# Patient Record
Sex: Male | Born: 1965 | Race: White | Hispanic: No | Marital: Single | Smoking: Current every day smoker
Health system: Southern US, Community
[De-identification: ages and names within clinical notes are randomized; demographics above are authoritative.]

## PROBLEM LIST (undated history)

## (undated) DIAGNOSIS — F419 Anxiety disorder, unspecified: Secondary | ICD-10-CM

## (undated) DIAGNOSIS — G4730 Sleep apnea, unspecified: Secondary | ICD-10-CM

## (undated) DIAGNOSIS — I10 Essential (primary) hypertension: Secondary | ICD-10-CM

## (undated) DIAGNOSIS — D649 Anemia, unspecified: Secondary | ICD-10-CM

## (undated) DIAGNOSIS — F32A Depression, unspecified: Secondary | ICD-10-CM

## (undated) DIAGNOSIS — R569 Unspecified convulsions: Secondary | ICD-10-CM

## (undated) DIAGNOSIS — E785 Hyperlipidemia, unspecified: Secondary | ICD-10-CM

## (undated) DIAGNOSIS — F1910 Other psychoactive substance abuse, uncomplicated: Secondary | ICD-10-CM

## (undated) DIAGNOSIS — F329 Major depressive disorder, single episode, unspecified: Secondary | ICD-10-CM

## (undated) DIAGNOSIS — Z5189 Encounter for other specified aftercare: Secondary | ICD-10-CM

## (undated) HISTORY — DX: Encounter for other specified aftercare: Z51.89

## (undated) HISTORY — PX: COLONOSCOPY: SHX174

## (undated) HISTORY — PX: SIGMOIDOSCOPY: SUR1295

## (undated) HISTORY — DX: Hyperlipidemia, unspecified: E78.5

## (undated) HISTORY — DX: Other psychoactive substance abuse, uncomplicated: F19.10

## (undated) HISTORY — DX: Anxiety disorder, unspecified: F41.9

## (undated) HISTORY — DX: Depression, unspecified: F32.A

## (undated) HISTORY — DX: Anemia, unspecified: D64.9

## (undated) HISTORY — DX: Sleep apnea, unspecified: G47.30

## (undated) HISTORY — DX: Essential (primary) hypertension: I10

## (undated) HISTORY — DX: Unspecified convulsions: R56.9

## (undated) HISTORY — DX: Major depressive disorder, single episode, unspecified: F32.9

---

## 2005-11-12 HISTORY — PX: ANKLE SURGERY: SHX546

## 2015-12-19 ENCOUNTER — Ambulatory Visit: Payer: Self-pay | Attending: Podiatry

## 2015-12-19 ENCOUNTER — Other Ambulatory Visit: Payer: Self-pay | Admitting: Sports Medicine

## 2015-12-19 ENCOUNTER — Ambulatory Visit: Payer: Self-pay

## 2015-12-19 DIAGNOSIS — B351 Tinea unguium: Secondary | ICD-10-CM

## 2015-12-19 MED ORDER — CICLOPIROX 8 % EX SOLN: Freq: Every day | CUTANEOUS | Status: DC

## 2016-02-29 ENCOUNTER — Telehealth: Payer: Self-pay | Admitting: Internal Medicine

## 2016-02-29 NOTE — Telephone Encounter (Signed)
yes

## 2016-02-29 NOTE — Telephone Encounter (Signed)
Pt called request to be Dr. Yetta BarreJones new pt, he said Dr. Lorenda CahillKathy Glenn refer him. Please Terrill Mohradise, [employer] is insurance.

## 2016-04-10 ENCOUNTER — Encounter: Payer: Self-pay | Admitting: Internal Medicine

## 2016-04-10 ENCOUNTER — Ambulatory Visit (INDEPENDENT_AMBULATORY_CARE_PROVIDER_SITE_OTHER): Payer: BLUE CROSS/BLUE SHIELD | Admitting: Internal Medicine

## 2016-04-10 VITALS — BP 118/70 | HR 67 | Temp 98.6°F | Resp 16 | Ht 65.0 in | Wt 159.0 lb

## 2016-04-10 DIAGNOSIS — R05 Cough: Secondary | ICD-10-CM

## 2016-04-10 DIAGNOSIS — F419 Anxiety disorder, unspecified: Secondary | ICD-10-CM

## 2016-04-10 DIAGNOSIS — Z23 Encounter for immunization: Secondary | ICD-10-CM | POA: Diagnosis not present

## 2016-04-10 DIAGNOSIS — R0600 Dyspnea, unspecified: Secondary | ICD-10-CM

## 2016-04-10 DIAGNOSIS — F418 Other specified anxiety disorders: Secondary | ICD-10-CM

## 2016-04-10 DIAGNOSIS — R059 Cough, unspecified: Secondary | ICD-10-CM | POA: Insufficient documentation

## 2016-04-10 DIAGNOSIS — N522 Drug-induced erectile dysfunction: Secondary | ICD-10-CM | POA: Diagnosis not present

## 2016-04-10 DIAGNOSIS — Z0000 Encounter for general adult medical examination without abnormal findings: Secondary | ICD-10-CM | POA: Diagnosis not present

## 2016-04-10 DIAGNOSIS — F329 Major depressive disorder, single episode, unspecified: Secondary | ICD-10-CM | POA: Insufficient documentation

## 2016-04-10 DIAGNOSIS — F1011 Alcohol abuse, in remission: Secondary | ICD-10-CM | POA: Insufficient documentation

## 2016-04-10 DIAGNOSIS — F32A Depression, unspecified: Secondary | ICD-10-CM | POA: Insufficient documentation

## 2016-04-10 DIAGNOSIS — R0609 Other forms of dyspnea: Secondary | ICD-10-CM | POA: Insufficient documentation

## 2016-04-10 DIAGNOSIS — Z720 Tobacco use: Secondary | ICD-10-CM

## 2016-04-10 LAB — FECAL OCCULT BLOOD, GUAIAC: FECAL OCCULT BLD: NEGATIVE

## 2016-04-10 MED ORDER — PAROXETINE HCL 20 MG PO TABS: 20.0000 mg | ORAL_TABLET | Freq: Every day | ORAL | Status: DC

## 2016-04-10 MED ORDER — BUPROPION HCL ER (SR) 100 MG PO TB12: 100.0000 mg | ORAL_TABLET | Freq: Every day | ORAL | Status: DC

## 2016-04-10 MED ORDER — GABAPENTIN 300 MG PO CAPS: 300.0000 mg | ORAL_CAPSULE | Freq: Three times a day (TID) | ORAL | Status: DC

## 2016-04-10 MED ORDER — SILDENAFIL CITRATE 50 MG PO TABS: 50.0000 mg | ORAL_TABLET | Freq: Every day | ORAL | Status: DC | PRN

## 2016-04-10 NOTE — Progress Notes (Signed)
Subjective:  Patient ID: Collin Horne, male    DOB: August 15, 1966  Age: 50 y.o. MRN: [id number]  CC: Annual Exam  NEW TO ME  HPI Collin Horne presents for a CPX.  He complains of chronic, nonproductive cough over the last 1-2 years with mild dyspnea on exertion. He has had no episodes of chest pain, diaphoresis, fatigue, syncope, near syncope, or palpitations. He has a 30+ pack year history of tobacco abuse.  He has a history of alcohol dependence and has been sober for about 15 months. He is also being treated for depression and anxiety with Paxil, Wellbutrin, and Neurontin. He is doing very well on this regimen and tells me he is going to be seeing a new psychiatrist in.  History Collin Horne has a past medical history of Hyperlipidemia; Substance abuse; and Depression.   He has past surgical history that includes Ankle surgery (2007).   His family history includes Alcohol abuse in his father; Arthritis in his mother; Depression in his father and sister; Early death in his father. There is no history of Heart disease, Hyperlipidemia, Hypertension, Kidney disease, Stroke, or Asthma.He reports that he has been smoking Cigarettes.  He has a 30 pack-year smoking history. He has never used smokeless tobacco. He reports that he does not drink alcohol. His drug history is not on file.  Outpatient Prescriptions Prior to Visit  Medication Sig Dispense Refill  . ciclopirox (PENLAC) 8 % solution Apply topically at bedtime. Apply over nail and surrounding skin. Apply daily over previous coat and file nail. After seven (7) days, may remove with alcohol and continue cycle. 6.6 mL 0   No facility-administered medications prior to visit.    ROS Review of Systems  Constitutional: Negative.  Negative for fever, chills, diaphoresis, appetite change and fatigue.  HENT: Negative.  Negative for sinus pressure, sore throat and trouble swallowing.   Eyes: Negative.  Negative for visual disturbance.    Respiratory: Positive for cough and shortness of breath. Negative for apnea, choking, chest tightness, wheezing and stridor.   Cardiovascular: Negative.  Negative for chest pain, palpitations and leg swelling.  Gastrointestinal: Negative.  Negative for nausea, vomiting, abdominal pain, diarrhea, constipation and blood in stool.  Endocrine: Negative.   Genitourinary: Negative.  Negative for dysuria, urgency, frequency, hematuria, flank pain, decreased urine volume, discharge, penile swelling, scrotal swelling, enuresis, difficulty urinating, genital sores, penile pain and testicular pain.       He complains of ED  Musculoskeletal: Negative.  Negative for myalgias, back pain, arthralgias and neck pain.  Skin: Negative.  Negative for color change and pallor.  Allergic/Immunologic: Negative.   Neurological: Negative.  Negative for dizziness, tremors, speech difficulty, weakness, light-headedness, numbness and headaches.  Hematological: Negative.  Negative for adenopathy. Does not bruise/bleed easily.  Psychiatric/Behavioral: Negative.     Objective:  BP 118/70 mmHg  Pulse 67  Temp(Src) 98.6 F (37 C) (Oral)  Resp 16  Ht 5\' 5"  (1.651 m)  Wt 159 lb (72.122 kg)  BMI 26.46 kg/m2  SpO2 98%  Physical Exam  Constitutional: He is oriented to person, place, and time. He appears well-developed and well-nourished. No distress.  HENT:  Mouth/Throat: Oropharynx is clear and moist. No oropharyngeal exudate.  Eyes: Conjunctivae are normal. Right eye exhibits no discharge. Left eye exhibits no discharge. No scleral icterus.  Neck: Normal range of motion. Neck supple. No JVD present. No tracheal deviation present. No thyromegaly present.  Cardiovascular: Normal rate, regular rhythm, normal heart sounds and intact distal  pulses.  Exam reveals no gallop and no friction rub.   No murmur heard. EKG -----  Sinus  Rhythm  -Short PR syndrome  PRi = 116 BORDERLINE RHYTHM   Pulmonary/Chest: Effort normal  and breath sounds normal. No stridor. No respiratory distress. He has no wheezes. He has no rales. He exhibits no tenderness.  Abdominal: Soft. Bowel sounds are normal. He exhibits no distension and no mass. There is no tenderness. There is no rebound and no guarding. Hernia confirmed negative in the right inguinal area and confirmed negative in the left inguinal area.  Genitourinary: Rectum normal, prostate normal, testes normal and penis normal. Rectal exam shows no external hemorrhoid, no internal hemorrhoid, no fissure, no mass, no tenderness and anal tone normal. Guaiac negative stool. Prostate is not enlarged and not tender. Right testis shows no mass, no swelling and no tenderness. Right testis is descended. Left testis shows no mass, no swelling and no tenderness. Left testis is descended. Circumcised. No penile erythema or penile tenderness. No discharge found.  Musculoskeletal: Normal range of motion. He exhibits no edema or tenderness.  Lymphadenopathy:    He has no cervical adenopathy.       Right: No inguinal adenopathy present.       Left: No inguinal adenopathy present.  Neurological: He is oriented to person, place, and time.  Skin: Skin is warm and dry. No rash noted. He is not diaphoretic. No erythema. No pallor.  Psychiatric: He has a normal mood and affect. His behavior is normal. Judgment and thought content normal.  Vitals reviewed.  Lab Results  Component Value Date   WBC 8.8 04/11/2016   HGB 12.5* 04/11/2016   HCT 37.4* 04/11/2016   PLT 365.0 04/11/2016   GLUCOSE 101* 04/11/2016   CHOL 173 04/11/2016   TRIG 183.0* 04/11/2016   HDL 39.90 04/11/2016   LDLCALC 96 04/11/2016   ALT 18 04/11/2016   AST 19 04/11/2016   NA 141 04/11/2016   K 4.8 04/11/2016   CL 107 04/11/2016   CREATININE 0.97 04/11/2016   BUN 6 04/11/2016   CO2 29 04/11/2016   TSH 2.04 04/11/2016   PSA 0.46 04/11/2016   Dg Chest 2 View  04/11/2016  CLINICAL DATA:  Current smoker, routine  examination, no current complaints, history of hyperlipidemia and cough and dyspnea on exertion EXAM: CHEST  2 VIEW COMPARISON:  None in PACs FINDINGS: The lungs are well-expanded. There is no focal infiltrate. The interstitial markings are mildly increased. The heart and pulmonary vascularity are normal. The mediastinum is normal in width. The bony thorax is unremarkable. IMPRESSION: Mild interstitial prominence consistent with the patient's smoking history. There is no pneumonia, CHF, nor other acute cardiopulmonary abnormality. Electronically Signed   By: David  [place] M.D.   On: 04/11/2016 10:52     Assessment & Plan:   Collin Horne was seen today for annual exam.  Diagnoses and all orders for this visit:  Routine general medical examination at a health care facility- exam completed, labs ordered and reviewed, vaccines reviewed and updated, patient education material was given. -     Lipid panel; Future -     Comprehensive metabolic panel; Future -     CBC with Differential/Platelet; Future -     PSA; Future -     TSH; Future -     Urinalysis, Routine w reflex microscopic (not at [facility]); Future -     HIV antibody; Future  Tobacco abuse -     Pulmonary Function  Test; Future  Alcohol abuse, in remission  Cough- his chest x-ray shows mild interstitial prominence consistent with history of tobacco abuse, he will undergo pulmonary function test to screen for COPD. -     DG Chest 2 View; Future -     Pulmonary Function Test; Future  DOE (dyspnea on exertion)- his EKG is negative for any signs of prior MI or ischemia, I think his symptoms are related to lung disease from tobacco abuse, he will undergo a pulmonary function test to investigate this further and I will treat as indicated. -     DG Chest 2 View; Future -     Pulmonary Function Test; Future -     EKG 12-Lead  Anxiety and depression -     PARoxetine (PAXIL) 20 MG tablet; Take 1 tablet (20 mg total) by mouth daily. -     buPROPion  (WELLBUTRIN SR) 100 MG 12 hr tablet; Take 1 tablet (100 mg total) by mouth daily. -     gabapentin (NEURONTIN) 300 MG capsule; Take 1 capsule (300 mg total) by mouth 3 (three) times daily.  Need for Tdap vaccination -     Tdap vaccine greater than or equal to 7yo IM  Drug-induced erectile dysfunction -     sildenafil (VIAGRA) 50 MG tablet; Take 1 tablet (50 mg total) by mouth daily as needed for erectile dysfunction.   I have changed Collin Horne PARoxetine, buPROPion, and gabapentin. I am also having him start on sildenafil. Additionally, I am having him maintain his ciclopirox and multivitamin with minerals.  Meds ordered this encounter  Medications  . DISCONTD: buPROPion (WELLBUTRIN SR) 100 MG 12 hr tablet    Sig:     Refill:  0  . DISCONTD: gabapentin (NEURONTIN) 300 MG capsule    Sig:     Refill:  0  . DISCONTD: PARoxetine (PAXIL) 20 MG tablet    Sig:     Refill:  0  . Multiple Vitamins-Minerals (MULTIVITAMIN WITH MINERALS) tablet    Sig: Take 1 tablet by mouth daily.  . sildenafil (VIAGRA) 50 MG tablet    Sig: Take 1 tablet (50 mg total) by mouth daily as needed for erectile dysfunction.    Dispense:  10 tablet    Refill:  2  . PARoxetine (PAXIL) 20 MG tablet    Sig: Take 1 tablet (20 mg total) by mouth daily.    Dispense:  90 tablet    Refill:  3  . buPROPion (WELLBUTRIN SR) 100 MG 12 hr tablet    Sig: Take 1 tablet (100 mg total) by mouth daily.    Dispense:  90 tablet    Refill:  3  . gabapentin (NEURONTIN) 300 MG capsule    Sig: Take 1 capsule (300 mg total) by mouth 3 (three) times daily.    Dispense:  270 capsule    Refill:  3     Follow-up: Return in about 3 months (around 07/11/2016).  Sanda Linger, MD

## 2016-04-10 NOTE — Progress Notes (Signed)
Pre visit review using our clinic review tool, if applicable. No additional management support is needed unless otherwise documented below in the visit note. 

## 2016-04-10 NOTE — Patient Instructions (Signed)

## 2016-04-11 ENCOUNTER — Other Ambulatory Visit (INDEPENDENT_AMBULATORY_CARE_PROVIDER_SITE_OTHER): Payer: BLUE CROSS/BLUE SHIELD

## 2016-04-11 ENCOUNTER — Encounter: Payer: Self-pay | Admitting: Internal Medicine

## 2016-04-11 ENCOUNTER — Ambulatory Visit (INDEPENDENT_AMBULATORY_CARE_PROVIDER_SITE_OTHER)
Admission: RE | Admit: 2016-04-11 | Discharge: 2016-04-11 | Disposition: A | Discharge status: Home / Self Care | Payer: BLUE CROSS/BLUE SHIELD | Source: Ambulatory Visit | Attending: Internal Medicine | Admitting: Internal Medicine

## 2016-04-11 DIAGNOSIS — R0600 Dyspnea, unspecified: Secondary | ICD-10-CM

## 2016-04-11 DIAGNOSIS — R05 Cough: Secondary | ICD-10-CM

## 2016-04-11 DIAGNOSIS — R059 Cough, unspecified: Secondary | ICD-10-CM

## 2016-04-11 DIAGNOSIS — R0609 Other forms of dyspnea: Secondary | ICD-10-CM

## 2016-04-11 DIAGNOSIS — Z0000 Encounter for general adult medical examination without abnormal findings: Secondary | ICD-10-CM | POA: Diagnosis not present

## 2016-04-11 LAB — LIPID PANEL
CHOLESTEROL: 173 mg/dL (ref 0–200)
HDL: 39.9 mg/dL (ref >39.00)
LDL Cholesterol: 96 mg/dL (ref 0–99)
NONHDL: 132.87
Total CHOL/HDL Ratio: 4
Triglycerides: 183 mg/dL — ABNORMAL HIGH (ref 0.0–149.0)
VLDL: 36.6 mg/dL (ref 0.0–40.0)

## 2016-04-11 LAB — COMPREHENSIVE METABOLIC PANEL
ALK PHOS: 79 U/L (ref 39–117)
ALT: 18 U/L (ref 0–53)
AST: 19 U/L (ref 0–37)
Albumin: 4.1 g/dL (ref 3.5–5.2)
BUN: 6 mg/dL (ref 6–23)
CO2: 29 meq/L (ref 19–32)
Calcium: 8.9 mg/dL (ref 8.4–10.5)
Chloride: 107 mEq/L (ref 96–112)
Creatinine, Ser: 0.97 mg/dL (ref 0.40–1.50)
GFR: 87.21 mL/min (ref >60.00)
GLUCOSE: 101 mg/dL — AB (ref 70–99)
POTASSIUM: 4.8 meq/L (ref 3.5–5.1)
SODIUM: 141 meq/L (ref 135–145)
TOTAL PROTEIN: 6.5 g/dL (ref 6.0–8.3)
Total Bilirubin: 0.3 mg/dL (ref 0.2–1.2)

## 2016-04-11 LAB — CBC WITH DIFFERENTIAL/PLATELET
Basophils Absolute: 0 10*3/uL (ref 0.0–0.1)
Basophils Relative: 0.4 % (ref 0.0–3.0)
EOS PCT: 3.8 % (ref 0.0–5.0)
Eosinophils Absolute: 0.3 10*3/uL (ref 0.0–0.7)
HCT: 37.4 % — ABNORMAL LOW (ref 39.0–52.0)
Hemoglobin: 12.5 g/dL — ABNORMAL LOW (ref 13.0–17.0)
LYMPHS ABS: 2.6 10*3/uL (ref 0.7–4.0)
Lymphocytes Relative: 29.5 % (ref 12.0–46.0)
MCHC: 33.5 g/dL (ref 30.0–36.0)
MCV: 96.5 fl (ref 78.0–100.0)
MONO ABS: 0.7 10*3/uL (ref 0.1–1.0)
Monocytes Relative: 8.3 % (ref 3.0–12.0)
NEUTROS ABS: 5.1 10*3/uL (ref 1.4–7.7)
NEUTROS PCT: 58 % (ref 43.0–77.0)
PLATELETS: 365 10*3/uL (ref 150.0–400.0)
RBC: 3.88 Mil/uL — AB (ref 4.22–5.81)
RDW: 15.6 % — AB (ref 11.5–15.5)
WBC: 8.8 10*3/uL (ref 4.0–10.5)

## 2016-04-11 LAB — URINALYSIS, ROUTINE W REFLEX MICROSCOPIC
Bilirubin Urine: NEGATIVE
Hgb urine dipstick: NEGATIVE
Ketones, ur: NEGATIVE
Leukocytes, UA: NEGATIVE
Nitrite: NEGATIVE
PH: 6.5 (ref 5.0–8.0)
RBC / HPF: NONE SEEN (ref 0–?)
SPECIFIC GRAVITY, URINE: 1.01 (ref 1.000–1.030)
TOTAL PROTEIN, URINE-UPE24: NEGATIVE
UROBILINOGEN UA: 0.2 (ref 0.0–1.0)
Urine Glucose: NEGATIVE
WBC UA: NONE SEEN (ref 0–?)

## 2016-04-11 LAB — PSA: PSA: 0.46 ng/mL (ref 0.10–4.00)

## 2016-04-11 LAB — HIV ANTIBODY (ROUTINE TESTING W REFLEX): HIV: NONREACTIVE

## 2016-04-11 LAB — TSH: TSH: 2.04 u[IU]/mL (ref 0.35–4.50)

## 2016-05-01 ENCOUNTER — Other Ambulatory Visit: Payer: Self-pay | Admitting: Internal Medicine

## 2016-05-01 ENCOUNTER — Telehealth: Payer: Self-pay

## 2016-05-01 DIAGNOSIS — F419 Anxiety disorder, unspecified: Principal | ICD-10-CM

## 2016-05-01 DIAGNOSIS — F329 Major depressive disorder, single episode, unspecified: Secondary | ICD-10-CM

## 2016-05-01 DIAGNOSIS — F32A Depression, unspecified: Secondary | ICD-10-CM

## 2016-05-01 MED ORDER — PAROXETINE HCL 20 MG PO TABS: 60.0000 mg | ORAL_TABLET | Freq: Every day | ORAL | Status: DC

## 2016-05-01 MED ORDER — PAROXETINE HCL 20 MG PO TABS: 20.0000 mg | ORAL_TABLET | Freq: Two times a day (BID) | ORAL | Status: DC

## 2016-05-01 MED ORDER — GABAPENTIN 300 MG PO CAPS: 600.0000 mg | ORAL_CAPSULE | Freq: Three times a day (TID) | ORAL | Status: DC

## 2016-05-01 NOTE — Telephone Encounter (Signed)
Patient states when he came in at last visit he requested his med list to reflect a change in how he was taking paxil----patient states he is taking 3 tab (20mg  each) by mouth once daily----however our med list on his emar does not say that currently----he was concerned that future rx refills would be sent in incorrectly and he run out too soon-----routing to dr jones---can you please review current med list noting that paxil has been taken as mentioned above and approve med list patient is wanting us to use----patient cannot be reached by phone until in morning from 8am until 11am----can call at phone number on snapshot----tamara to call patient back

## 2016-05-01 NOTE — Telephone Encounter (Signed)
Paxil 20 mg - current sig: 1  Tab daily Patient states it should be 1 tab bid Gabapentin 300 mg - current sig: 1 tab tid Patient states it should be 2 tabs tid  Please confirm if current sig or the patient stated sig is correct.

## 2016-05-01 NOTE — Telephone Encounter (Signed)
Corrected and sent

## 2016-05-01 NOTE — Telephone Encounter (Signed)
Pt lm on triage this morning regarding medications and stated that they were not correct.  Pt stated that he would not get another break until about 03:30pm.    Called pt without pt picking up call 4 times in a row.  Will continue to call pt.

## 2016-05-01 NOTE — Telephone Encounter (Signed)
[facility] [address]   LVM for pt to call back as soon as possible.   RE: letting know that erx were sent with new sigs.

## 2016-05-01 NOTE — Telephone Encounter (Signed)
Rx's corrected and sent

## 2016-05-01 NOTE — Telephone Encounter (Signed)
Patient said his rx are all messed up. The dose and sure. The gabapentin (NEURONTIN) 300 MG capsule [[id number]][[id number]] should be 2 pills 3x a day  The  PARoxetine (PAXIL) 20 MG tablet [[id number]][[id number]]      Should be 2pills. He said that he left a message on triage as well. If you need to speak with him you will have to call him in the morning because he will be at work until 9pm Can you please follow up, Thank you

## 2016-05-02 NOTE — Telephone Encounter (Signed)
Left message verifying all dosages for med list now corrected---asked patient to call back if he needs to verify anything else or if he would like a copy faxed to him---also advised that new rx were sent to his pharm in case he was running short per dr Yetta Barrejones note

## 2016-06-15 ENCOUNTER — Encounter (HOSPITAL_COMMUNITY): Payer: Self-pay | Admitting: Emergency Medicine

## 2016-06-15 ENCOUNTER — Emergency Department (HOSPITAL_COMMUNITY)
Admission: EM | Admit: 2016-06-15 | Discharge: 2016-06-16 | Disposition: A | Discharge status: Home / Self Care | Payer: BLUE CROSS/BLUE SHIELD | Attending: Emergency Medicine | Admitting: Emergency Medicine

## 2016-06-15 DIAGNOSIS — R45851 Suicidal ideations: Secondary | ICD-10-CM | POA: Insufficient documentation

## 2016-06-15 DIAGNOSIS — F32A Depression, unspecified: Secondary | ICD-10-CM

## 2016-06-15 DIAGNOSIS — F10920 Alcohol use, unspecified with intoxication, uncomplicated: Secondary | ICD-10-CM

## 2016-06-15 DIAGNOSIS — F10129 Alcohol abuse with intoxication, unspecified: Secondary | ICD-10-CM | POA: Diagnosis not present

## 2016-06-15 DIAGNOSIS — F17210 Nicotine dependence, cigarettes, uncomplicated: Secondary | ICD-10-CM | POA: Insufficient documentation

## 2016-06-15 DIAGNOSIS — Z79899 Other long term (current) drug therapy: Secondary | ICD-10-CM | POA: Insufficient documentation

## 2016-06-15 DIAGNOSIS — Z79891 Long term (current) use of opiate analgesic: Secondary | ICD-10-CM | POA: Insufficient documentation

## 2016-06-15 DIAGNOSIS — F329 Major depressive disorder, single episode, unspecified: Secondary | ICD-10-CM | POA: Diagnosis not present

## 2016-06-15 DIAGNOSIS — F1010 Alcohol abuse, uncomplicated: Secondary | ICD-10-CM

## 2016-06-15 LAB — COMPREHENSIVE METABOLIC PANEL
ALT: 18 U/L (ref 17–63)
ANION GAP: 11 (ref 5–15)
AST: 24 U/L (ref 15–41)
Albumin: 4.7 g/dL (ref 3.5–5.0)
Alkaline Phosphatase: 87 U/L (ref 38–126)
BUN: 10 mg/dL (ref 6–20)
CHLORIDE: 102 mmol/L (ref 101–111)
CO2: 22 mmol/L (ref 22–32)
Calcium: 8.8 mg/dL — ABNORMAL LOW (ref 8.9–10.3)
Creatinine, Ser: 0.71 mg/dL (ref 0.61–1.24)
Glucose, Bld: 99 mg/dL (ref 65–99)
POTASSIUM: 3.5 mmol/L (ref 3.5–5.1)
Sodium: 135 mmol/L (ref 135–145)
Total Bilirubin: 0.6 mg/dL (ref 0.3–1.2)
Total Protein: 8.2 g/dL — ABNORMAL HIGH (ref 6.5–8.1)

## 2016-06-15 LAB — CBC
HCT: 37.8 % — ABNORMAL LOW (ref 39.0–52.0)
Hemoglobin: 13.4 g/dL (ref 13.0–17.0)
MCH: 33.4 pg (ref 26.0–34.0)
MCHC: 35.4 g/dL (ref 30.0–36.0)
MCV: 94.3 fL (ref 78.0–100.0)
PLATELETS: 334 10*3/uL (ref 150–400)
RBC: 4.01 MIL/uL — AB (ref 4.22–5.81)
RDW: 14 % (ref 11.5–15.5)
WBC: 9.3 10*3/uL (ref 4.0–10.5)

## 2016-06-15 LAB — RAPID URINE DRUG SCREEN, HOSP PERFORMED
AMPHETAMINES: NOT DETECTED
BENZODIAZEPINES: NOT DETECTED
Barbiturates: NOT DETECTED
Cocaine: NOT DETECTED
OPIATES: NOT DETECTED
Tetrahydrocannabinol: NOT DETECTED

## 2016-06-15 LAB — SALICYLATE LEVEL

## 2016-06-15 LAB — ETHANOL: ALCOHOL ETHYL (B): 295 mg/dL — AB (ref <5)

## 2016-06-15 LAB — ACETAMINOPHEN LEVEL

## 2016-06-15 MED ORDER — STERILE WATER FOR INJECTION IJ SOLN
INTRAMUSCULAR | Status: AC
  Filled 2016-06-15: qty 10

## 2016-06-15 MED ORDER — ZIPRASIDONE MESYLATE 20 MG IM SOLR
10.0000 mg | Freq: Once | INTRAMUSCULAR | Status: AC
  Administered 2016-06-15: 10 mg via INTRAMUSCULAR
  Filled 2016-06-15: qty 20

## 2016-06-15 NOTE — ED Notes (Signed)
Pt was aggravated with the wait time. This RN re-explained the process of being seen as a psych patient to the pt. Pt seemed to understand

## 2016-06-15 NOTE — ED Notes (Addendum)
Pt put his regular clothes back on and attempted to leave the unit. Staff asked pt to come back inside. Pt acquiesced. Pt refused to put back on paper scrubs until a doctor came to talk to him. While this RN went to get the EDP, pt attempted to leave again. Security and staff escorted pt back into the building. At this point pt was verbally and physically aggressive towards staff.

## 2016-06-16 MED ORDER — LORAZEPAM 1 MG PO TABS: 0.0000 mg | ORAL_TABLET | Freq: Four times a day (QID) | ORAL | Status: DC

## 2016-06-16 MED ORDER — SODIUM CHLORIDE 0.9 % IV BOLUS (SEPSIS)
1000.0000 mL | Freq: Once | INTRAVENOUS | Status: AC
  Administered 2016-06-16: 1000 mL via INTRAVENOUS

## 2016-06-16 MED ORDER — PAROXETINE HCL 30 MG PO TABS
60.0000 mg | ORAL_TABLET | Freq: Every day | ORAL | Status: DC
  Filled 2016-06-16: qty 2

## 2016-06-16 MED ORDER — GABAPENTIN 300 MG PO CAPS: 600.0000 mg | ORAL_CAPSULE | Freq: Three times a day (TID) | ORAL | Status: DC

## 2016-06-16 MED ORDER — LORAZEPAM 1 MG PO TABS: 0.0000 mg | ORAL_TABLET | Freq: Two times a day (BID) | ORAL | Status: DC

## 2016-06-16 NOTE — ED Provider Notes (Signed)
Patient has slept for most of the night and is now awake and alert this morning. Repeat examination reveals that he is pleasant and is in no way homicidal or suicidal. He is therefore appropriate for discharge.   Gilda Crease, MD 06/16/16 [phone]

## 2016-06-16 NOTE — ED Notes (Signed)
TTS in progress 

## 2016-06-16 NOTE — ED Provider Notes (Signed)
[facility]-EMERGENCY DEPT Provider Note   CSN: [id number] Arrival date & time: 06/15/16  2152  First Provider Contact:  None       History   Chief Complaint Chief Complaint  Patient presents with  . Suicidal  . Alcohol Intoxication  . Medical Clearance    HPI Collin Horne is a 50 y.o. male.  HPI 49 year old male with a history of depression, alcohol abuse and dependence presents with concern for worsening depression, and relapse of alcohol abuse with alcohol intoxication, and reported on arrival to the emergency department that he was having suicidal thoughts. Patient presented to the emergency department intoxicated, reporting he was feeling suicidal, and then immediately tried to leave the emergency department.   On reevaluation, after receiving Geodon and after 24hr hold placed, patient is calm, cooperative, and at this time denies any suicidal ideation, homicidal ideation, auditory/visual hallucinations. He does report worsening depression in setting of stopping Wellbutrin 1 month ago, as well as relapse of alcohol abuse and dependence over the last 10 days. Reports that he spoke with [facility] who reported that they need to gather more information and may be days before he would be able to admitted to rehabilitation. Patient reports that in his intoxicated mind, he felt that he would seize the moment that he desired detox and came to the ED for evaluation and said he was suicidal in order to obtain help.  Does acknowledge that he has had some suicidal ideations, but no plan and would not follow through.   Has psychiatrist who he has seen one time one month ago, and has psychologist who he has seen for years.  Reports he stopped drinking for about 2 yrs until 10 days ago.  Has hx of withdrawal and withdrawal seizures.  Initial report of suicidal ideation, history of depression, history of prior overdose per pt in 1997, and hx of alcohol dependence,   Past Medical History:    Diagnosis Date  . Depression   . Hyperlipidemia   . Substance abuse     Patient Active Problem List   Diagnosis Date Noted  . Routine general medical examination at a health care facility 04/10/2016  . Tobacco abuse 04/10/2016  . Alcohol abuse, in remission 04/10/2016  . Cough 04/10/2016  . DOE (dyspnea on exertion) 04/10/2016  . Anxiety and depression 04/10/2016  . Need for Tdap vaccination 04/10/2016  . Drug-induced erectile dysfunction 04/10/2016    Past Surgical History:  Procedure Laterality Date  . ANKLE SURGERY  2007       Home Medications    Prior to Admission medications   Medication Sig Start Date End Date Taking? Authorizing Provider  buPROPion (WELLBUTRIN SR) 100 MG 12 hr tablet Take 1 tablet (100 mg total) by mouth daily. 04/10/16  Yes Etta Grandchild, MD  gabapentin (NEURONTIN) 300 MG capsule Take 2 capsules (600 mg total) by mouth 3 (three) times daily. 05/01/16  Yes Etta Grandchild, MD  Multiple Vitamins-Minerals (MULTIVITAMIN WITH MINERALS) tablet Take 1 tablet by mouth daily.   Yes Historical Provider, MD  PARoxetine (PAXIL) 20 MG tablet Take 3 tablets (60 mg total) by mouth daily. 05/01/16  Yes Etta Grandchild, MD  sildenafil (VIAGRA) 50 MG tablet Take 1 tablet (50 mg total) by mouth daily as needed for erectile dysfunction. 04/10/16  Yes Etta Grandchild, MD  ciclopirox (PENLAC) 8 % solution Apply topically at bedtime. Apply over nail and surrounding skin. Apply daily over previous coat and file nail. After seven (  7) days, may remove with alcohol and continue cycle. Patient not taking: Reported on 06/15/2016 12/19/15   Asencion Islam, DPM    Family History Family History  Problem Relation Age of Onset  . Arthritis Mother   . Alcohol abuse Father   . Depression Father   . Early death Father   . Depression Sister   . Heart disease Neg Hx   . Hyperlipidemia Neg Hx   . Hypertension Neg Hx   . Kidney disease Neg Hx   . Stroke Neg Hx   . Asthma Neg Hx      Social History Social History  Substance Use Topics  . Smoking status: Current Every Day Smoker    Packs/day: 1.00    Years: 30.00    Types: Cigarettes  . Smokeless tobacco: Never Used  . Alcohol use 0.0 oz/week     Allergies   Review of patient's allergies indicates no known allergies.   Review of Systems Review of Systems  Unable to perform ROS: Psychiatric disorder  Psychiatric/Behavioral: Positive for suicidal ideas (reported initially on arrival to ED).  Patient too agitated to provide history   Physical Exam Updated Vital Signs BP 109/79 (BP Location: Left Arm) Comment (BP Location):   Pulse 94   Temp 98.9 F (37.2 C) (Oral)   Resp 16   Ht  (1.676 m)   Wt 150 lb (68 kg)   SpO2 95%   BMI 24.21 kg/m   Physical Exam  Constitutional: He is oriented to person, place, and time. He appears well-developed and well-nourished. He is uncooperative. No distress.  Agitated, trying to get out of bed, yelling   HENT:  Head: Normocephalic and atraumatic.  Eyes: Conjunctivae and EOM are normal.  Neck: Normal range of motion.  Cardiovascular: Normal rate, regular rhythm and intact distal pulses.   Pulmonary/Chest: Effort normal. No respiratory distress.  Musculoskeletal: He exhibits no edema.  Neurological: He is alert and oriented to person, place, and time.  Skin: Skin is warm and dry. He is not diaphoretic.  Nursing note and vitals reviewed.    ED Treatments / Results  Labs (all labs ordered are listed, but only abnormal results are displayed) Labs Reviewed  COMPREHENSIVE METABOLIC PANEL - Abnormal; Notable for the following:       Result Value   Calcium 8.8 (*)    Total Protein 8.2 (*)    All other components within normal limits  ETHANOL - Abnormal; Notable for the following:    Alcohol, Ethyl (B) 295 (*)    All other components within normal limits  ACETAMINOPHEN LEVEL - Abnormal; Notable for the following:    Acetaminophen (Tylenol), Serum <10  (*)    All other components within normal limits  CBC - Abnormal; Notable for the following:    RBC 4.01 (*)    HCT 37.8 (*)    All other components within normal limits  SALICYLATE LEVEL  URINE RAPID DRUG SCREEN, HOSP PERFORMED    EKG  EKG Interpretation  Date/Time:  Saturday June 16 2016 02:13:05 EDT Ventricular Rate:  83 PR Interval:    QRS Duration: 81 QT Interval:  384 QTC Calculation: 452 R Axis:   59 Text Interpretation:  Sinus rhythm No previous ECGs available Confirmed by [facility] MD, Reality Dejonge ([id number]) on 06/16/2016 2:16:02 AM       Radiology No results found.  Procedures Procedures (including critical care time)  Medications Ordered in ED Medications  sterile water (preservative free) injection (not administered)  LORazepam (ATIVAN) tablet 0-4 mg (0 mg Oral Not Given 06/16/16 0148)    Followed by  LORazepam (ATIVAN) tablet 0-4 mg (not administered)  gabapentin (NEURONTIN) capsule 600 mg (not administered)  PARoxetine (PAXIL) tablet 60 mg (not administered)  sodium chloride 0.9 % bolus 1,000 mL (not administered)  ziprasidone (GEODON) injection 10 mg (10 mg Intramuscular Given 06/15/16 2323)  sodium chloride 0.9 % bolus 1,000 mL (1,000 mLs Intravenous New Bag/Given 06/16/16 0146)     Initial Impression / Assessment and Plan / ED Course  I have reviewed the triage vital signs and the nursing notes.  Pertinent labs & imaging results that were available during my care of the patient were reviewed by me and considered in my medical decision making (see chart for details).  Clinical Course   50 year old male with a history of depression, alcohol abuse and dependence presents with concern for worsening depression, and relapse of alcohol abuse with alcohol intoxication, and reported on arrival to the emergency department that he was having suicidal thoughts. Patient presented to the emergency department intoxicated, reporting he was feeling suicidal, and then immediately  tried to leave the emergency department. Given concerns that he is unable to make decisions for himself except alcohol intoxication, as well as concern that he is a danger to himself stating he had suicidal ideation, a 24-hour involuntary hold was placed, and patient was escorted back to the emergency department.  Patient was intoxicated, agitated, actively trying to leave and aggressive towards staff and was given 10 mg of IM Geodon.  Prior to Geodon attempted to obtain more thorough history, however patient too agitated to participate.    On reevaluation, after receiving Geodon, patient is calm, cooperative, and at this time denies any suicidal ideation, homicidal ideation, auditory versus visual hallucinations. He does report worsening depression in setting of stopping Wellbutrin 1 month ago, as well as relapse of alcohol abuse and dependence over the last 10 days. Reports that he spoke with [facility] who reported that they need to gather more information and may be days before he would be able to admitted to rehabilitation. Patient reports that in his intoxicated mind, he felt that he would seize the moment that he desired detox and came to the ED for evaluation and said he was suicidal in order to obtain help.  Does acknowledge that he has had some suicidal ideations, but no plan and would not follow through.   Given patient's initial report of suicidal ideation, history of depression, history of prior overdose per pt in 1997, and hx of alcohol dependence, will consult TTS for evaluation.    Hx prior to 24hr hold was limited however given immediate concern for his safety involuntary 24hr hold placed.  If TTS evaluates patient and if they feel he is appropriate for outpatient follow up and [facility] when pt clinically sober, 24hr hold may be reversed, however at this time pt intoxicated and will continue to hold and monitor. Placed home meds, CIWA placed.  BP borderline at this time, pt  sleeping, likely secondary to etoh and geodon and will continue to monitor and give IV fluids.  Final Clinical Impressions(s) / ED Diagnoses   Final diagnoses:  Alcohol abuse  Depression  Suicidal ideations    New Prescriptions New Prescriptions   No medications on file     Alvira Monday, MD 06/16/16 [phone]

## 2016-06-16 NOTE — ED Notes (Signed)
Pt's friend Andree Moro will be coming to pick pt up.  She can be reached at [phone].

## 2016-06-16 NOTE — BH Assessment (Signed)
Assessment Note  Collin Horne is an 50 y.o. male., who reports driving himself to [facility].  Patient reports feeling "impatient" with the admission process to be admitted to [facility].  He reports they are currently processing his paperwork and he will hear from them in 24 to 48 hours.  Patient reports telling [facility] Staff "that I was suicidal to speed up the process in getting into [facility]."  Patient denied current SI and reports "I never have been suicidal."  Patient was orientated x4, mood "slightly depressed", affect drowsy and congruent with mood.  Patient denied HI and AVH.  Patient reports living in a sober living home and that he relapsed to alcohol use 10 days ago after being clean and sober for 18 months.  Patient reports consuming 7 to 8 (12) ounce beers daily and reports consuming until he passed out.  Patient reports experiencing mild withdrawal symptoms the next day.  He denied any other illicit substance use and UDS was negative.   Patient denied any change in appetite and reports feeling sad about relapsing and isolating so he could drink alone.  Patient reports only one hospitalization, substance use residential treatment for alcohol use in 2013.  Patient reports seeing a Psychologist on a weekly basis for alcohol use and a Psychiatrist for medication management.  Patient reports wanting to discharge and follow through with his plan to be admitted to [facility], return to sober living house and attend AA meetings.  He also plans to ask housemates to support his detox and sobriety until he can be admitted to [facility].  Patient also reports plans to attend appointment with Psychologist on 06-19-2016.  Patient did not meet inpatient criteria.      Diagnosis: Alcohol Use Disorder, severe, Major Depressive Disorder, recurrent, mild  Past Medical History:  Past Medical History:  Diagnosis Date  . Depression   . Hyperlipidemia   . Substance abuse     Past Surgical History:   Procedure Laterality Date  . ANKLE SURGERY  2007    Family History:  Family History  Problem Relation Age of Onset  . Arthritis Mother   . Alcohol abuse Father   . Depression Father   . Early death Father   . Depression Sister   . Heart disease Neg Hx   . Hyperlipidemia Neg Hx   . Hypertension Neg Hx   . Kidney disease Neg Hx   . Stroke Neg Hx   . Asthma Neg Hx     Social History:  reports that he has been smoking Cigarettes.  He has a 30.00 pack-year smoking history. He has never used smokeless tobacco. He reports that he drinks alcohol. His drug history is not on file.  Additional Social History:     CIWA: CIWA-Ar BP: 109/74 Pulse Rate: 82 Nausea and Vomiting: no nausea and no vomiting Tactile Disturbances: none Tremor: no tremor Auditory Disturbances: not present Paroxysmal Sweats: no sweat visible Visual Disturbances: not present Anxiety: no anxiety, at ease Headache, Fullness in Head: none present Agitation: normal activity Orientation and Clouding of Sensorium: oriented and can do serial additions CIWA-Ar Total: 0 COWS:    Allergies: No Known Allergies  Home Medications:  (Not in a hospital admission)  OB/GYN Status:  No LMP for male patient.  General Assessment Data [facility] of Assessment: [facility] TTS Assessment: In system Is this a Tele or Face-to-Face Assessment?: Tele Assessment Is this an Initial Assessment or a Re-assessment for this encounter?: Initial Assessment Marital status:  Single Maiden name: N/A Is patient pregnant?: No Pregnancy Status: No Living Arrangements: Other (Comment) (Sober living house) Can pt return to current living arrangement?: Yes Admission Status: Voluntary Is patient capable of signing voluntary admission?: Yes Referral Source: Self/Family/Friend Insurance type: Nurse, adult Exam [facility] Walk-in ONLY) Medical Exam completed: Yes  Crisis Care Plan Living Arrangements: Other (Comment) (Sober living  house) Legal Guardian: Other: (Self) Name of Psychiatrist: Dr. Olin Hauser Name of Therapist: Scheryl Darter (Psychologist)  Education Status Is patient currently in school?: No Current Grade: N/A Highest grade of school patient has completed: 12th Name of school: N/A Contact person: N/A  Risk to self with the past 6 months Suicidal Ideation: No Has patient been a risk to self within the past 6 months prior to admission? : No Suicidal Intent: No Has patient had any suicidal intent within the past 6 months prior to admission? : No Is patient at risk for suicide?: No Suicidal Plan?: No Has patient had any suicidal plan within the past 6 months prior to admission? : No Access to Means: No What has been your use of drugs/alcohol within the last 12 months?: Alcohol Previous Attempts/Gestures: No How many times?: 0 Other Self Harm Risks: No Triggers for Past Attempts: None known Intentional Self Injurious Behavior: None Family Suicide History: No Recent stressful life event(s): Other (Comment) (Patient reports losing patient with getting into rehab) Persecutory voices/beliefs?: No Depression: Yes Depression Symptoms: Tearfulness, Isolating (Patient preference to drink alone, sad about current relapse) Substance abuse history and/or treatment for substance abuse?: Yes Suicide prevention information given to non-admitted patients: Not applicable  Risk to Others within the past 6 months Homicidal Ideation: No Does patient have any lifetime risk of violence toward others beyond the six months prior to admission? : No Thoughts of Harm to Others: No Current Homicidal Intent: No Current Homicidal Plan: No Access to Homicidal Means: No Identified Victim: N/A History of harm to others?: No Assessment of Violence: On admission (Patient attempted to leave hospital 2x) Violent Behavior Description: Aggressive about leaving hospital Does patient have access to weapons?: No Criminal Charges  Pending?: No (Patient denied) Does patient have a court date: No (Patient denied) Is patient on probation?: No (Patient denied)  Psychosis Hallucinations: None noted Delusions: None noted  Mental Status Report Appearance/Hygiene: In scrubs Eye Contact: Good Motor Activity: Unremarkable Speech: Logical/coherent Level of Consciousness: Drowsy Mood: Depressed, Anxious Affect: Anxious, Depressed Anxiety Level: Minimal Thought Processes: Relevant, Coherent Judgement: Partial Orientation: Person, Place, Time, Situation Obsessive Compulsive Thoughts/Behaviors: None  Cognitive Functioning Concentration: Good Memory: Recent Intact, Remote Intact IQ: Average Insight: Fair Impulse Control: Fair Appetite: Good Weight Loss: 0 Weight Gain: 0 Sleep: Decreased Total Hours of Sleep: 5 Vegetative Symptoms: None  ADLScreening [facility] Services) Patient's cognitive ability adequate to safely complete daily activities?: Yes Patient able to express need for assistance with ADLs?: Yes Independently performs ADLs?: Yes (appropriate for developmental age)  Prior Inpatient Therapy Prior Inpatient Therapy: Yes (Substance use rehab) Prior Therapy Dates: 2013 Prior Therapy Facilty/Provider(s): in [place] Reason for Treatment: Alcohol dependence  Prior Outpatient Therapy Prior Outpatient Therapy: Yes Prior Therapy Dates: Current Prior Therapy Facilty/Provider(s): Dr. Cammie Sickle [facility]-Psychologist) Reason for Treatment: alcohol use Does patient have an ACCT team?: No Does patient have Intensive In-House Services?  : No Does patient have [facility] services? : No Does patient have P4CC services?: No  ADL Screening (condition at time of admission) Patient's cognitive ability adequate to safely complete daily activities?: Yes Patient  able to express need for assistance with ADLs?: Yes Independently performs ADLs?: Yes (appropriate for developmental age)              Advance Directives (For Healthcare) Does patient have an advance directive?: No    Additional Information 1:1 In Past 12 Months?: No CIRT Risk: No Elopement Risk: Yes Does patient have medical clearance?: Yes     Disposition:  Disposition Initial Assessment Completed for this Encounter: Yes Disposition of Patient: Outpatient treatment Type of outpatient treatment: Adult  On Site Evaluation by:   Reviewed with Physician:    Dey-Johnson,Jalynn Waddell 06/16/2016 3:41 AM

## 2016-06-16 NOTE — ED Notes (Signed)
Pt denies SI/HI at time of discharge.

## 2016-06-16 NOTE — Discharge Summary (Signed)
Pt is calm and cooperative at this time.  Restraints all removed.  Pt changed into scrubs.  Cardiac monitor on and vs cycling.  Will continue to monitor.

## 2016-06-20 ENCOUNTER — Encounter (INDEPENDENT_AMBULATORY_CARE_PROVIDER_SITE_OTHER): Payer: BLUE CROSS/BLUE SHIELD | Admitting: Internal Medicine

## 2016-06-20 DIAGNOSIS — Z720 Tobacco use: Secondary | ICD-10-CM

## 2016-06-20 DIAGNOSIS — R0609 Other forms of dyspnea: Secondary | ICD-10-CM

## 2016-06-20 DIAGNOSIS — R0600 Dyspnea, unspecified: Secondary | ICD-10-CM

## 2016-06-20 DIAGNOSIS — R05 Cough: Secondary | ICD-10-CM

## 2016-06-20 DIAGNOSIS — R059 Cough, unspecified: Secondary | ICD-10-CM

## 2016-06-20 LAB — PULMONARY FUNCTION TEST
DL/VA % pred: 82 %
DL/VA: 3.56 ml/min/mmHg/L
DLCO COR % PRED: 78 %
DLCO UNC % PRED: 74 %
DLCO UNC: 19.16 ml/min/mmHg
DLCO cor: 20.2 ml/min/mmHg
FEF 25-75 PRE: 2.92 L/s
FEF 25-75 Post: 4.24 L/sec
FEF2575-%Change-Post: 45 %
FEF2575-%Pred-Post: 140 %
FEF2575-%Pred-Pre: 96 %
FEV1-%Change-Post: 11 %
FEV1-%PRED-POST: 94 %
FEV1-%Pred-Pre: 85 %
FEV1-POST: 3.15 L
FEV1-Pre: 2.84 L
FEV1FVC-%Change-Post: 2 %
FEV1FVC-%Pred-Pre: 104 %
FEV6-%CHANGE-POST: 7 %
FEV6-%PRED-POST: 91 %
FEV6-%PRED-PRE: 84 %
FEV6-PRE: 3.46 L
FEV6-Post: 3.74 L
FEV6FVC-%CHANGE-POST: 0 %
FEV6FVC-%PRED-PRE: 103 %
FEV6FVC-%Pred-Post: 102 %
FVC-%CHANGE-POST: 8 %
FVC-%Pred-Post: 88 %
FVC-%Pred-Pre: 81 %
FVC-Post: 3.78 L
FVC-Pre: 3.48 L
POST FEV1/FVC RATIO: 83 %
POST FEV6/FVC RATIO: 99 %
PRE FEV1/FVC RATIO: 82 %
Pre FEV6/FVC Ratio: 100 %
RV % pred: 122 %
RV: 2.16 L
TLC % PRED: 100 %
TLC: 5.99 L

## 2016-06-21 ENCOUNTER — Telehealth: Payer: Self-pay | Admitting: Internal Medicine

## 2016-06-21 NOTE — Telephone Encounter (Signed)
Patient called back.  Gave him MD response from breathing test.

## 2017-05-20 IMAGING — DX DG CHEST 2V
2 series · 2 of 2 positions shown · non-contrast
Comparison: None in PACs

CLINICAL DATA: Current smoker, routine examination, no current
complaints, history of hyperlipidemia and cough and dyspnea on
exertion

EXAM:
CHEST  2 VIEW

[chest pa]
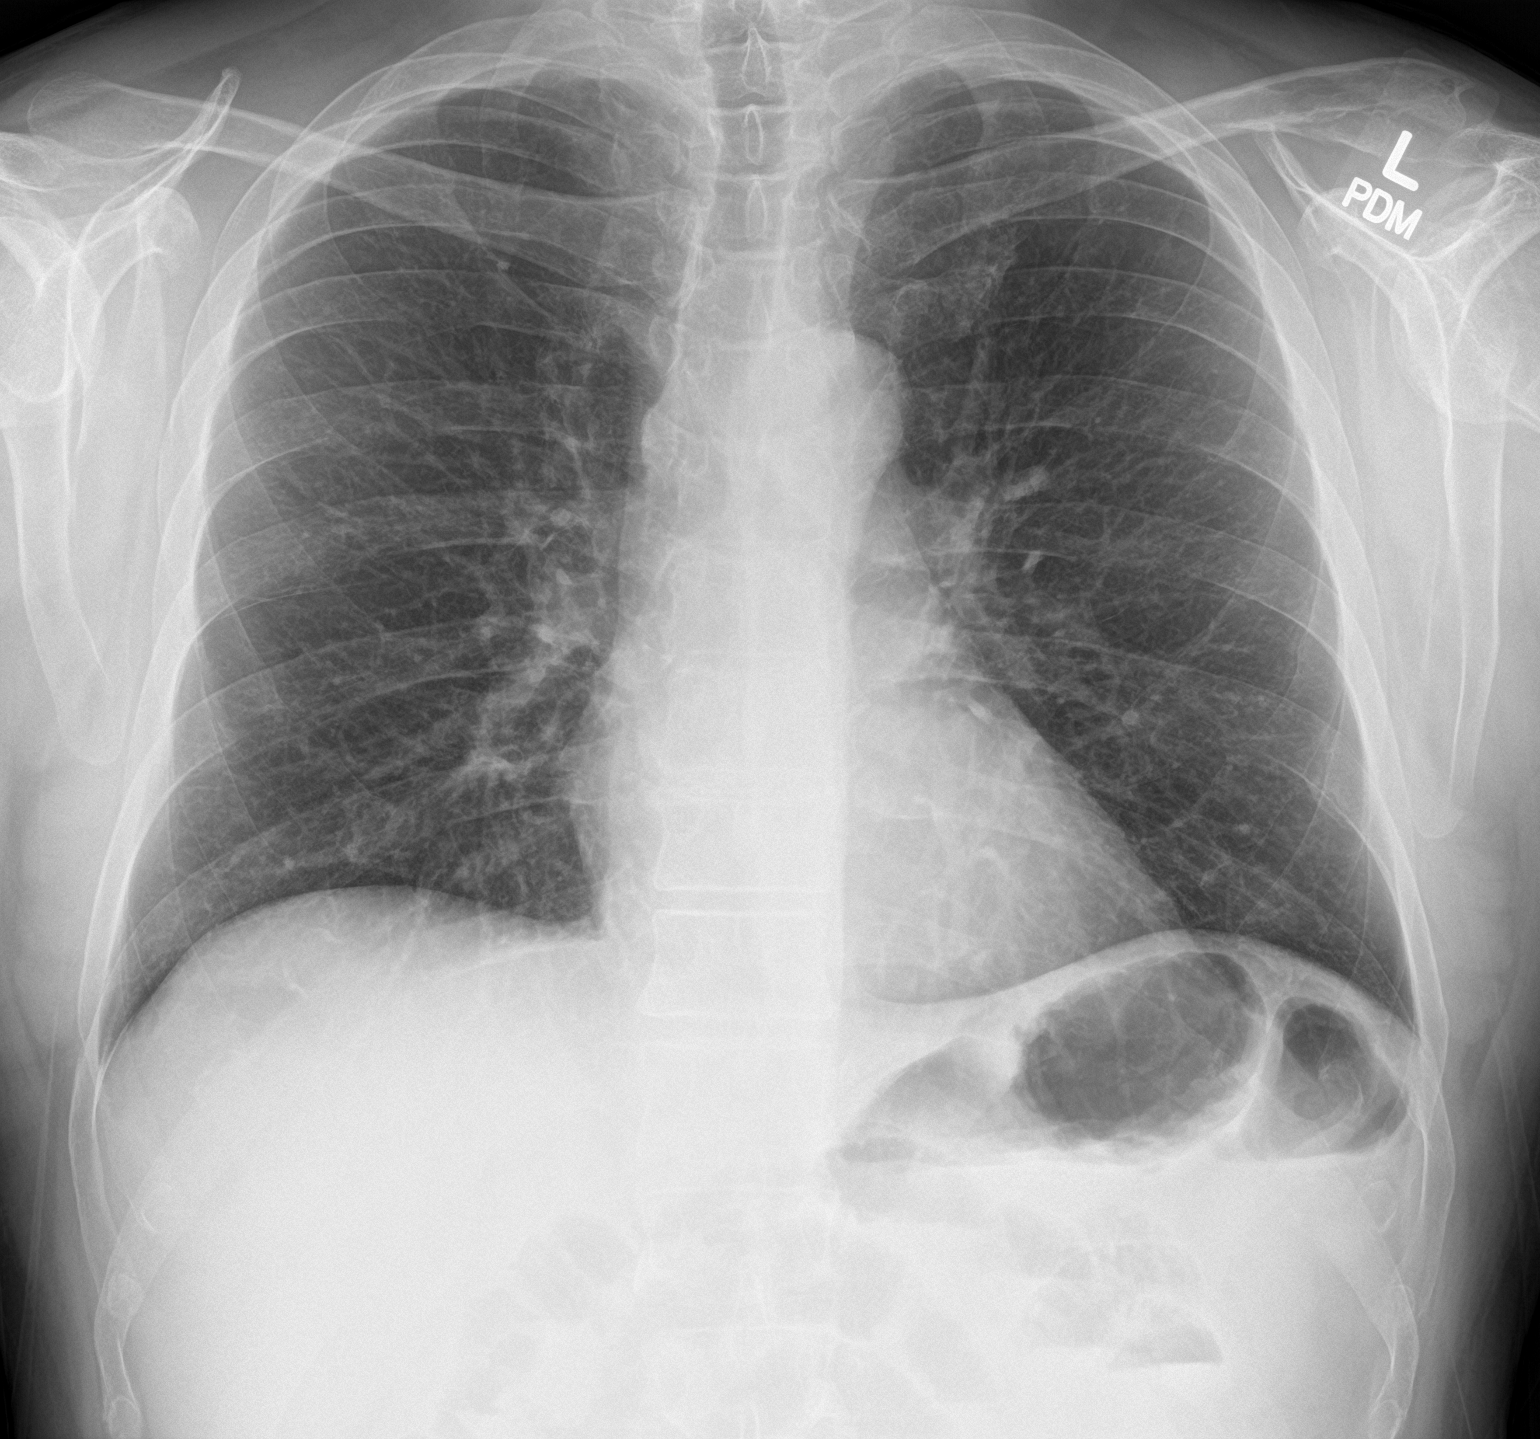

[chest lat]
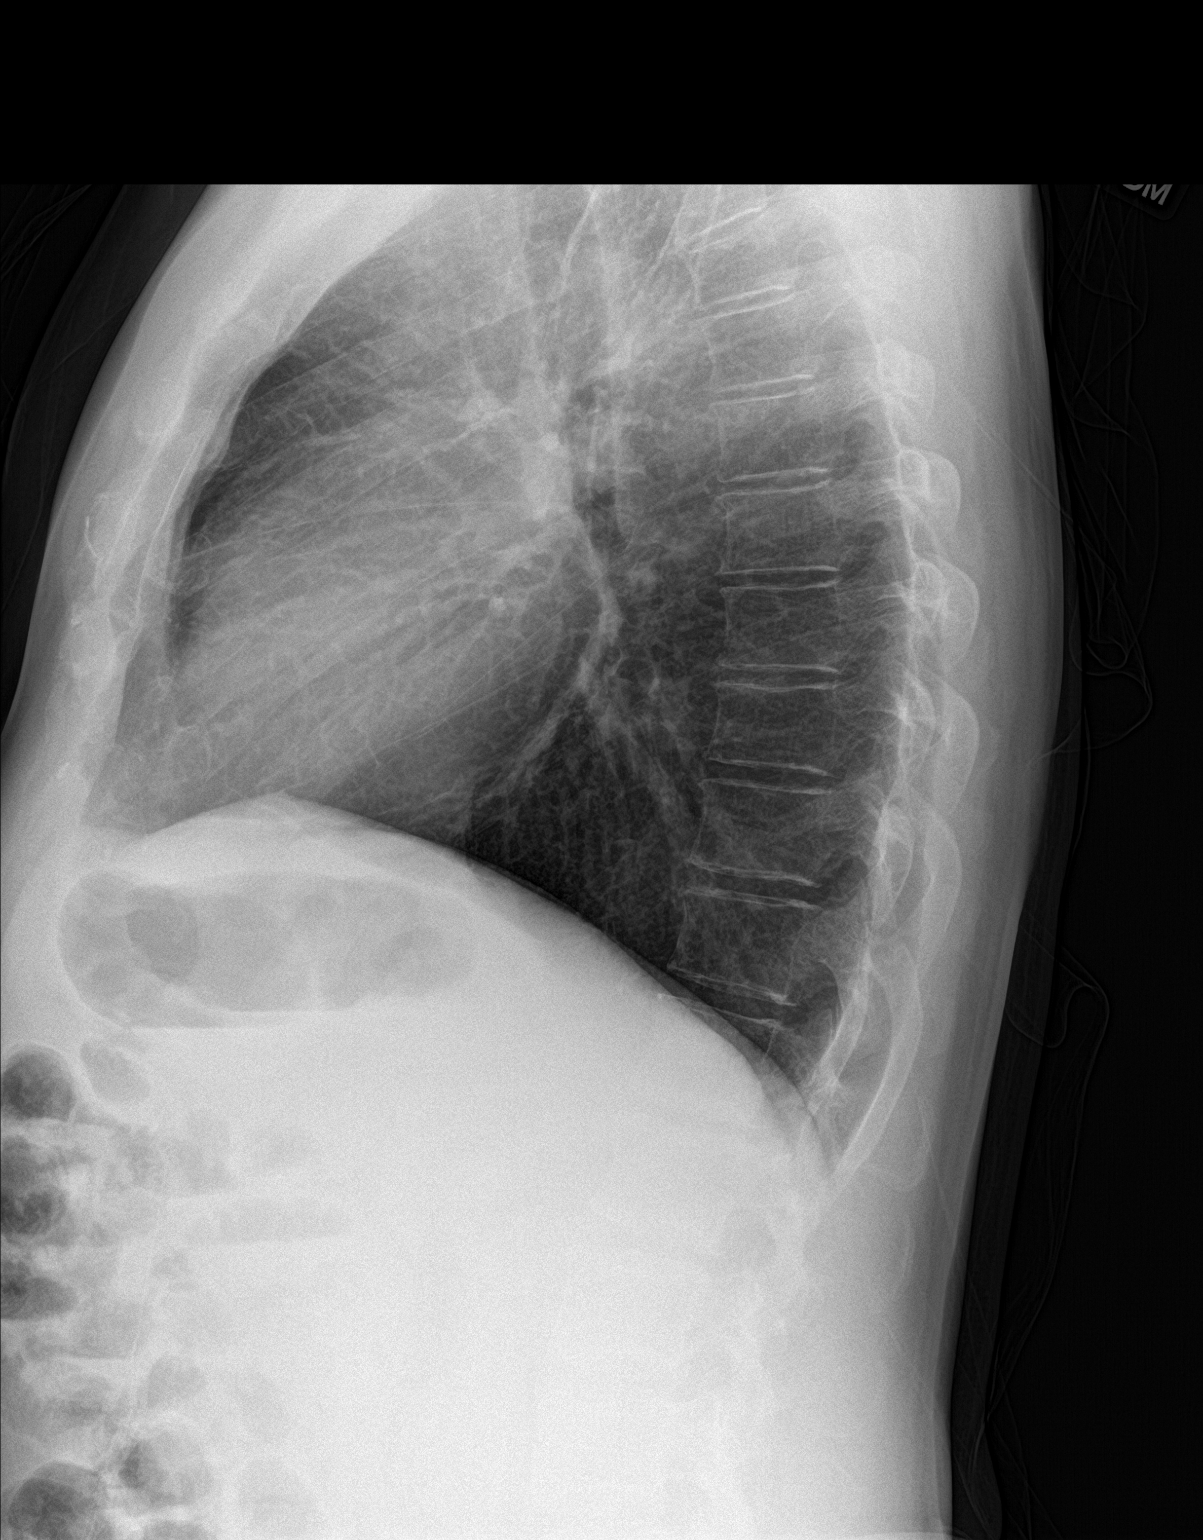

[2 of 2 positions shown; findings below may reference images not displayed]

FINDINGS: The lungs are well-expanded. There is no focal infiltrate. The
interstitial markings are mildly increased. The heart and pulmonary
vascularity are normal. The mediastinum is normal in width. The bony
thorax is unremarkable.
IMPRESSION: Mild interstitial prominence consistent with the patient's smoking
history. There is no pneumonia, CHF, nor other acute cardiopulmonary
abnormality.

## 2017-06-17 ENCOUNTER — Encounter: Payer: Self-pay | Admitting: Internal Medicine

## 2017-06-17 ENCOUNTER — Other Ambulatory Visit (INDEPENDENT_AMBULATORY_CARE_PROVIDER_SITE_OTHER): Payer: BLUE CROSS/BLUE SHIELD

## 2017-06-17 ENCOUNTER — Ambulatory Visit (INDEPENDENT_AMBULATORY_CARE_PROVIDER_SITE_OTHER)
Admission: RE | Admit: 2017-06-17 | Discharge: 2017-06-17 | Disposition: A | Discharge status: Home / Self Care | Payer: BLUE CROSS/BLUE SHIELD | Source: Ambulatory Visit | Attending: Internal Medicine | Admitting: Internal Medicine

## 2017-06-17 ENCOUNTER — Ambulatory Visit (INDEPENDENT_AMBULATORY_CARE_PROVIDER_SITE_OTHER): Payer: BLUE CROSS/BLUE SHIELD | Admitting: Internal Medicine

## 2017-06-17 VITALS — BP 110/62 | HR 87 | Temp 98.9°F | Resp 16 | Ht 66.0 in | Wt 158.2 lb

## 2017-06-17 DIAGNOSIS — L219 Seborrheic dermatitis, unspecified: Secondary | ICD-10-CM | POA: Insufficient documentation

## 2017-06-17 DIAGNOSIS — B351 Tinea unguium: Secondary | ICD-10-CM

## 2017-06-17 DIAGNOSIS — M5030 Other cervical disc degeneration, unspecified cervical region: Secondary | ICD-10-CM | POA: Insufficient documentation

## 2017-06-17 DIAGNOSIS — Z0000 Encounter for general adult medical examination without abnormal findings: Secondary | ICD-10-CM | POA: Diagnosis not present

## 2017-06-17 DIAGNOSIS — M79675 Pain in left toe(s): Secondary | ICD-10-CM

## 2017-06-17 DIAGNOSIS — M542 Cervicalgia: Secondary | ICD-10-CM | POA: Diagnosis not present

## 2017-06-17 DIAGNOSIS — D539 Nutritional anemia, unspecified: Secondary | ICD-10-CM | POA: Diagnosis not present

## 2017-06-17 DIAGNOSIS — M79674 Pain in right toe(s): Secondary | ICD-10-CM | POA: Diagnosis not present

## 2017-06-17 LAB — CBC WITH DIFFERENTIAL/PLATELET
Basophils Absolute: 0.1 10*3/uL (ref 0.0–0.1)
Basophils Relative: 0.4 % (ref 0.0–3.0)
EOS PCT: 2.2 % (ref 0.0–5.0)
Eosinophils Absolute: 0.3 10*3/uL (ref 0.0–0.7)
HCT: 38.6 % — ABNORMAL LOW (ref 39.0–52.0)
Hemoglobin: 13.1 g/dL (ref 13.0–17.0)
LYMPHS ABS: 4 10*3/uL (ref 0.7–4.0)
Lymphocytes Relative: 34.3 % (ref 12.0–46.0)
MCHC: 33.9 g/dL (ref 30.0–36.0)
MCV: 102.1 fl — AB (ref 78.0–100.0)
MONO ABS: 0.7 10*3/uL (ref 0.1–1.0)
MONOS PCT: 6.4 % (ref 3.0–12.0)
NEUTROS ABS: 6.6 10*3/uL (ref 1.4–7.7)
Neutrophils Relative %: 56.7 % (ref 43.0–77.0)
PLATELETS: 373 10*3/uL (ref 150.0–400.0)
RBC: 3.78 Mil/uL — ABNORMAL LOW (ref 4.22–5.81)
RDW: 14 % (ref 11.5–15.5)
WBC: 11.6 10*3/uL — ABNORMAL HIGH (ref 4.0–10.5)

## 2017-06-17 LAB — COMPREHENSIVE METABOLIC PANEL
ALK PHOS: 59 U/L (ref 39–117)
ALT: 18 U/L (ref 0–53)
AST: 18 U/L (ref 0–37)
Albumin: 4.4 g/dL (ref 3.5–5.2)
BUN: 16 mg/dL (ref 6–23)
CHLORIDE: 97 meq/L (ref 96–112)
CO2: 31 meq/L (ref 19–32)
Calcium: 9.3 mg/dL (ref 8.4–10.5)
Creatinine, Ser: 0.99 mg/dL (ref 0.40–1.50)
GFR: 84.77 mL/min (ref >60.00)
Glucose, Bld: 124 mg/dL — ABNORMAL HIGH (ref 70–99)
POTASSIUM: 3.6 meq/L (ref 3.5–5.1)
Sodium: 138 mEq/L (ref 135–145)
Total Bilirubin: 0.5 mg/dL (ref 0.2–1.2)
Total Protein: 7.2 g/dL (ref 6.0–8.3)

## 2017-06-17 LAB — PSA: PSA: 1.01 ng/mL (ref 0.10–4.00)

## 2017-06-17 MED ORDER — DESONIDE 0.05 % EX LOTN: TOPICAL_LOTION | Freq: Two times a day (BID) | CUTANEOUS | 2 refills | Status: DC

## 2017-06-17 MED ORDER — CICLOPIROX OLAMINE 0.77 % EX CREA: TOPICAL_CREAM | Freq: Two times a day (BID) | CUTANEOUS | 1 refills | Status: DC

## 2017-06-17 MED ORDER — DESONIDE 0.05 % EX LOTN
TOPICAL_LOTION | Freq: Two times a day (BID) | CUTANEOUS | 2 refills | Status: DC
Start: 2017-06-17 — End: 2017-06-17

## 2017-06-17 NOTE — Progress Notes (Signed)
Subjective:  Patient ID: Collin Horne, male    DOB: July 20, 1966  Age: 51 y.o. MRN: [id number]  CC: Rash; Annual Exam; and Neck Pain   HPI Collin Horne presents for CPX.  He complains of persistent facial rash for several months. He has tried to treat it with topical Selsun shampoo. He describes intermittent irritation, flaking, scaling, and redness.  He also complains of chronic neck pain when he turns his head to the right. The pain occasionally radiates into his right upper extremity but he's had no numbness weakness or tingling in his arms or legs. He takes Motrin for symptom relief.  He complains of painful disformed great toenails. About a year ago he tried a topical antifungal which was not effective.  Outpatient Medications Prior to Visit  Medication Sig Dispense Refill  . gabapentin (NEURONTIN) 300 MG capsule Take 2 capsules (600 mg total) by mouth 3 (three) times daily. 540 capsule 1  . PARoxetine (PAXIL) 20 MG tablet Take 3 tablets (60 mg total) by mouth daily. 270 tablet 3  . sildenafil (VIAGRA) 50 MG tablet Take 1 tablet (50 mg total) by mouth daily as needed for erectile dysfunction. 10 tablet 2  . buPROPion (WELLBUTRIN SR) 100 MG 12 hr tablet Take 1 tablet (100 mg total) by mouth daily. 90 tablet 3  . ciclopirox (PENLAC) 8 % solution Apply topically at bedtime. Apply over nail and surrounding skin. Apply daily over previous coat and file nail. After seven (7) days, may remove with alcohol and continue cycle. (Patient not taking: Reported on 06/15/2016) 6.6 mL 0  . Multiple Vitamins-Minerals (MULTIVITAMIN WITH MINERALS) tablet Take 1 tablet by mouth daily.     No facility-administered medications prior to visit.     ROS Review of Systems  Constitutional: Negative.  Negative for diaphoresis, fatigue and unexpected weight change.  HENT: Negative.   Eyes: Negative.   Respiratory: Negative.  Negative for apnea, cough, chest tightness, shortness of breath and wheezing.     Cardiovascular: Negative for chest pain, palpitations and leg swelling.  Gastrointestinal: Negative for abdominal pain, blood in stool, constipation, diarrhea, nausea and vomiting.  Endocrine: Negative.   Genitourinary: Negative.  Negative for difficulty urinating, dysuria, penile swelling, scrotal swelling, testicular pain and urgency.  Musculoskeletal: Positive for neck pain. Negative for arthralgias, back pain, gait problem and neck stiffness.  Skin: Positive for rash. Negative for color change, pallor and wound.  Allergic/Immunologic: Negative.   Neurological: Negative.  Negative for dizziness, weakness, numbness and headaches.  Hematological: Negative for adenopathy. Does not bruise/bleed easily.  Psychiatric/Behavioral: Negative.     Objective:  BP 110/62 (BP Location: Left Arm, Patient Position: Sitting, Cuff Size: Normal)   Pulse 87   Temp 98.9 F (37.2 C) (Oral)   Resp 16   Ht 5\' 6"  (1.676 m)   Wt 158 lb 4 oz (71.8 kg)   SpO2 99%   BMI 25.54 kg/m   BP Readings from Last 3 Encounters:  06/17/17 110/62  06/16/16 95/59  04/10/16 118/70    Wt Readings from Last 3 Encounters:  06/17/17 158 lb 4 oz (71.8 kg)  06/15/16 150 lb (68 kg)  04/10/16 159 lb (72.1 kg)    Physical Exam  Constitutional: He is oriented to person, place, and time. No distress.  HENT:  Mouth/Throat: Oropharynx is clear and moist. No oropharyngeal exudate.  Eyes: Conjunctivae are normal. Right eye exhibits no discharge. Left eye exhibits no discharge. No scleral icterus.  Neck: Normal range of motion. Neck  supple. No JVD present. No thyromegaly present.  Cardiovascular: Normal rate, regular rhythm and intact distal pulses.  Exam reveals no gallop and no friction rub.   No murmur heard. Pulmonary/Chest: Effort normal and breath sounds normal. No respiratory distress. He has no wheezes. He has no rales. He exhibits no tenderness.  Abdominal: Soft. Bowel sounds are normal. He exhibits no distension  and no mass. There is no tenderness. There is no rebound and no guarding. Hernia confirmed negative in the right inguinal area and confirmed negative in the left inguinal area.  Genitourinary: Rectum normal, prostate normal and penis normal. Rectal exam shows no external hemorrhoid, no internal hemorrhoid, no fissure, no mass, no tenderness, anal tone normal and guaiac negative stool. Prostate is not enlarged and not tender. Right testis shows no mass, no swelling and no tenderness. Right testis is descended. Left testis shows no mass, no swelling and no tenderness. Left testis is descended. Circumcised. No penile erythema or penile tenderness. No discharge found.  Musculoskeletal: Normal range of motion. He exhibits no edema, tenderness or deformity.  Lymphadenopathy:    He has no cervical adenopathy.       Right: No inguinal adenopathy present.       Left: No inguinal adenopathy present.  Neurological: He is alert and oriented to person, place, and time.  Skin: Skin is warm and dry. Rash noted. He is not diaphoretic. No erythema. No pallor.  Both great toenails show nail thickening, subungual debris, and lysis. There is no erythema or exudate.  Over both sides of the face, extending from the nasolabial folds into the malar surface there are groups of scaly papules with mild erythema.  Psychiatric: He has a normal mood and affect. His behavior is normal. Judgment and thought content normal.  Vitals reviewed.   Lab Results  Component Value Date   WBC 11.6 (H) 06/17/2017   HGB 13.1 06/17/2017   HCT 38.6 (L) 06/17/2017   PLT 373.0 06/17/2017   GLUCOSE 124 (H) 06/17/2017   CHOL 173 04/11/2016   TRIG 183.0 (H) 04/11/2016   HDL 39.90 04/11/2016   LDLCALC 96 04/11/2016   ALT 18 06/17/2017   AST 18 06/17/2017   NA 138 06/17/2017   K 3.6 06/17/2017   CL 97 06/17/2017   CREATININE 0.99 06/17/2017   BUN 16 06/17/2017   CO2 31 06/17/2017   TSH 2.04 04/11/2016   PSA 1.01 06/17/2017    Dg  Cervical Spine Complete  Result Date: 06/17/2017 CLINICAL DATA:  51 year old male with right-sided neck pain for 6 months. No known injury. Initial encounter. EXAM: CERVICAL SPINE - COMPLETE 4+ VIEW COMPARISON:  None. FINDINGS: Straightening of the cervical spine. No significant disc space narrowing. Very mild C4-5 and C5-6 facet degenerative changes and uncinate hypertrophy. Minimal right-sided C4-5 and C5-6 foraminal narrowing. No fracture or abnormal prevertebral soft tissue swelling. Lung apices are clear. IMPRESSION: Straightening of the cervical spine. No significant disc space narrowing. Very mild C4-5 and C5-6 facet degenerative changes and uncinate hypertrophy. Minimal right-sided C4-5 and C5-6 foraminal narrowing. Electronically Signed   By: Lacy DuverneySteven  Olson M.D.   On: 06/17/2017 20:05    No results found.  Assessment & Plan:   Collin Horne was seen today for rash, annual exam and neck pain.  Diagnoses and all orders for this visit:  Routine general medical examination at a health care facility- exam completed, labs reviewed, vaccines reviewed and updated, to screen for colon cancer and colon polyps Cologuard was ordered, patient education  material was given. -     Lipid panel; Future -     PSA; Future  Deficiency anemia- he continues to be mildly anemic. I've asked him to return in a few weeks to check for vitamin deficiencies. He does have a history of alcohol abuse so this may be related to bone marrow suppression from alcohol intake. -     CBC with Differential/Platelet; Future -     Comprehensive metabolic panel; Future  Seborrheic dermatitis -     Discontinue: desonide (DESOWEN) 0.05 % lotion; Apply topically 2 (two) times daily. -     Discontinue: ciclopirox (LOPROX) 0.77 % cream; Apply topically 2 (two) times daily. -     Discontinue: desonide (DESOWEN) 0.05 % lotion; Apply topically 2 (two) times daily. -     ciclopirox (LOPROX) 0.77 % cream; Apply topically 2 (two) times daily. -      desonide (DESOWEN) 0.05 % lotion; Apply topically 2 (two) times daily.  Pain due to onychomycosis of toenails of both feet- his LFTs are normal. Will start terbinafine. He was asked to return in 3-4 weeks for a recheck of his liver enzymes and in the meantime he will let me know if he develops rash, abdominal pain, nausea, or vomiting. -     Comprehensive metabolic panel; Future  Neck pain on right side- his plain film shows DDD with some concern for foraminal encroachment. He will let me know if he wants to pursue additional treatment options regarding this. -     DG Cervical Spine Complete; Future   I have discontinued Collin Horne multivitamin with minerals and buPROPion. I am also having him maintain his sildenafil, gabapentin, PARoxetine, ARIPiprazole, CHANTIX, ciclopirox, and desonide.  Meds ordered this encounter  Medications  . ARIPiprazole (ABILIFY) 5 MG tablet    Sig: Take 5 mg by mouth daily.    Refill:  0  . CHANTIX 1 MG tablet    Refill:  0  . DISCONTD: desonide (DESOWEN) 0.05 % lotion    Sig: Apply topically 2 (two) times daily.    Dispense:  59 mL    Refill:  2  . DISCONTD: ciclopirox (LOPROX) 0.77 % cream    Sig: Apply topically 2 (two) times daily.    Dispense:  15 g    Refill:  1  . DISCONTD: desonide (DESOWEN) 0.05 % lotion    Sig: Apply topically 2 (two) times daily.    Dispense:  59 mL    Refill:  2  . ciclopirox (LOPROX) 0.77 % cream    Sig: Apply topically 2 (two) times daily.    Dispense:  15 g    Refill:  1  . desonide (DESOWEN) 0.05 % lotion    Sig: Apply topically 2 (two) times daily.    Dispense:  59 mL    Refill:  2     Follow-up: Return in about 4 weeks (around 07/15/2017).  Sanda Linger, MD

## 2017-06-17 NOTE — Patient Instructions (Signed)

## 2017-06-18 ENCOUNTER — Encounter: Payer: Self-pay | Admitting: Internal Medicine

## 2017-06-19 ENCOUNTER — Telehealth: Payer: Self-pay

## 2017-06-19 ENCOUNTER — Encounter: Payer: Self-pay | Admitting: Internal Medicine

## 2017-06-19 ENCOUNTER — Other Ambulatory Visit: Payer: Self-pay | Admitting: Internal Medicine

## 2017-06-19 DIAGNOSIS — M5030 Other cervical disc degeneration, unspecified cervical region: Secondary | ICD-10-CM

## 2017-06-19 MED ORDER — TERBINAFINE HCL 250 MG PO TABS: 250.0000 mg | ORAL_TABLET | Freq: Every day | ORAL | 0 refills | Status: DC

## 2017-06-19 NOTE — Telephone Encounter (Signed)
KEY: [id number]  PA approved

## 2017-06-20 ENCOUNTER — Encounter: Payer: Self-pay | Admitting: Internal Medicine

## 2017-06-20 NOTE — Telephone Encounter (Signed)
Pt aware rx was sent to [facility]...Raechel Chute/lmb

## 2017-06-21 ENCOUNTER — Telehealth: Payer: Self-pay

## 2017-06-21 NOTE — Telephone Encounter (Signed)
Order [id number]

## 2017-07-01 ENCOUNTER — Encounter: Payer: Self-pay | Admitting: Internal Medicine

## 2017-07-16 ENCOUNTER — Encounter: Payer: Self-pay | Admitting: Internal Medicine

## 2017-07-16 ENCOUNTER — Other Ambulatory Visit (INDEPENDENT_AMBULATORY_CARE_PROVIDER_SITE_OTHER): Payer: BLUE CROSS/BLUE SHIELD

## 2017-07-16 ENCOUNTER — Ambulatory Visit (INDEPENDENT_AMBULATORY_CARE_PROVIDER_SITE_OTHER): Payer: BLUE CROSS/BLUE SHIELD | Admitting: Internal Medicine

## 2017-07-16 VITALS — BP 112/76 | HR 64 | Temp 98.1°F | Resp 16 | Ht 66.0 in | Wt 156.0 lb

## 2017-07-16 DIAGNOSIS — F1011 Alcohol abuse, in remission: Secondary | ICD-10-CM | POA: Diagnosis not present

## 2017-07-16 DIAGNOSIS — B351 Tinea unguium: Secondary | ICD-10-CM | POA: Diagnosis not present

## 2017-07-16 DIAGNOSIS — M79674 Pain in right toe(s): Secondary | ICD-10-CM | POA: Diagnosis not present

## 2017-07-16 DIAGNOSIS — E781 Pure hyperglyceridemia: Secondary | ICD-10-CM

## 2017-07-16 DIAGNOSIS — L309 Dermatitis, unspecified: Secondary | ICD-10-CM | POA: Insufficient documentation

## 2017-07-16 DIAGNOSIS — M79675 Pain in left toe(s): Secondary | ICD-10-CM

## 2017-07-16 LAB — HEPATIC FUNCTION PANEL
ALBUMIN: 4.5 g/dL (ref 3.5–5.2)
ALT: 12 U/L (ref 0–53)
AST: 17 U/L (ref 0–37)
Alkaline Phosphatase: 58 U/L (ref 39–117)
Bilirubin, Direct: 0.1 mg/dL (ref 0.0–0.3)
Total Bilirubin: 0.2 mg/dL (ref 0.2–1.2)
Total Protein: 7.4 g/dL (ref 6.0–8.3)

## 2017-07-16 LAB — LIPID PANEL
CHOLESTEROL: 202 mg/dL — AB (ref 0–200)
HDL: 54.6 mg/dL (ref >39.00)
LDL Cholesterol: 112 mg/dL — ABNORMAL HIGH (ref 0–99)
NonHDL: 147.07
TRIGLYCERIDES: 173 mg/dL — AB (ref 0.0–149.0)
Total CHOL/HDL Ratio: 4
VLDL: 34.6 mg/dL (ref 0.0–40.0)

## 2017-07-16 MED ORDER — NALTREXONE HCL 50 MG PO TABS: 50.0000 mg | ORAL_TABLET | Freq: Every day | ORAL | 1 refills | Status: DC

## 2017-07-16 MED ORDER — TERBINAFINE HCL 250 MG PO TABS: 250.0000 mg | ORAL_TABLET | Freq: Every day | ORAL | 0 refills | Status: AC

## 2017-07-16 NOTE — Progress Notes (Signed)
Subjective:  Patient ID: Collin Horne, male    DOB: 09-Mar-1966  Age: 51 y.o. MRN: [id number]  CC: Rash   HPI Collin Horne presents for f/up - He complains of an itchy rash in an isolated area on his left torso for the last week. He has tolerated terbinafine well with no abdominal pain, nausea, vomiting, diarrhea, or loss of appetite. He is happy to report that it looks like the base of his great toenails are growing out normally. He recently did inpatient rehabilitation for alcohol dependence and has been treated with vivitrol injections to reduce cravings but he complains that the injections are too expensive and he wants to know if there is a less expensive alternative.  Outpatient Medications Prior to Visit  Medication Sig Dispense Refill  . ARIPiprazole (ABILIFY) 5 MG tablet Take 5 mg by mouth daily.  0  . ciclopirox (LOPROX) 0.77 % cream Apply topically 2 (two) times daily. 15 g 1  . desonide (DESOWEN) 0.05 % lotion Apply topically 2 (two) times daily. 59 mL 2  . gabapentin (NEURONTIN) 300 MG capsule Take 2 capsules (600 mg total) by mouth 3 (three) times daily. 540 capsule 1  . PARoxetine (PAXIL) 20 MG tablet Take 3 tablets (60 mg total) by mouth daily. (Patient taking differently: Take 60 mg by mouth daily. Patient states that he take a 30mg  tablet daily.) 270 tablet 3  . sildenafil (VIAGRA) 50 MG tablet Take 1 tablet (50 mg total) by mouth daily as needed for erectile dysfunction. 10 tablet 2  . terbinafine (LAMISIL) 250 MG tablet Take 1 tablet (250 mg total) by mouth daily. 30 tablet 0  . CHANTIX 1 MG tablet   0   No facility-administered medications prior to visit.     ROS Review of Systems  Constitutional: Negative.  Negative for activity change, appetite change, chills, diaphoresis, fatigue, fever and unexpected weight change.  HENT: Negative.  Negative for sore throat.   Eyes: Negative.   Respiratory: Negative.  Negative for cough, chest tightness, shortness of breath  and wheezing.   Cardiovascular: Negative for chest pain, palpitations and leg swelling.  Gastrointestinal: Negative for abdominal pain, constipation, diarrhea, nausea and vomiting.  Endocrine: Negative.   Genitourinary: Negative.   Musculoskeletal: Negative.  Negative for back pain, myalgias and neck pain.  Skin: Positive for rash. Negative for color change and wound.  Allergic/Immunologic: Negative.   Neurological: Negative.  Negative for dizziness, weakness and headaches.  Hematological: Negative.  Negative for adenopathy. Does not bruise/bleed easily.  Psychiatric/Behavioral: Negative.  The patient is not nervous/anxious.     Objective:  BP 112/76 (BP Location: Left Arm, Patient Position: Sitting, Cuff Size: Normal)   Pulse 64   Temp 98.1 F (36.7 C) (Oral)   Resp 16   Ht 5\' 6"  (1.676 m)   Wt 156 lb (70.8 kg)   SpO2 100%   BMI 25.18 kg/m   BP Readings from Last 3 Encounters:  07/16/17 112/76  06/17/17 110/62  06/16/16 95/59    Wt Readings from Last 3 Encounters:  07/16/17 156 lb (70.8 kg)  06/17/17 158 lb 4 oz (71.8 kg)  06/15/16 150 lb (68 kg)    Physical Exam  Constitutional: He is oriented to person, place, and time. No distress.  HENT:  Mouth/Throat: Oropharynx is clear and moist. No oropharyngeal exudate.  Eyes: Conjunctivae are normal. Right eye exhibits no discharge. Left eye exhibits no discharge. No scleral icterus.  Neck: Normal range of motion. Neck supple. No  JVD present. No thyromegaly present.  Cardiovascular: Normal rate, regular rhythm and intact distal pulses.  Exam reveals no gallop and no friction rub.   No murmur heard. Pulmonary/Chest: Effort normal and breath sounds normal. No respiratory distress. He has no wheezes. He has no rales. He exhibits no tenderness.  Abdominal: Soft. Bowel sounds are normal. He exhibits no distension and no mass. There is no tenderness. There is no rebound and no guarding.  Musculoskeletal: Normal range of motion. He  exhibits no edema, tenderness or deformity.  Lymphadenopathy:    He has no cervical adenopathy.  Neurological: He is alert and oriented to person, place, and time.  Skin: Skin is warm and dry. Rash noted. Rash is papular. He is not diaphoretic. No erythema. No pallor.     The base of both great toenails are growing out normally but the rest of the great toenails continue to show subungual debris and lysis.  Psychiatric: He has a normal mood and affect. His behavior is normal. Judgment and thought content normal.  Vitals reviewed.   Lab Results  Component Value Date   WBC 11.6 (H) 06/17/2017   HGB 13.1 06/17/2017   HCT 38.6 (L) 06/17/2017   PLT 373.0 06/17/2017   GLUCOSE 124 (H) 06/17/2017   CHOL 202 (H) 07/16/2017   TRIG 173.0 (H) 07/16/2017   HDL 54.60 07/16/2017   LDLCALC 112 (H) 07/16/2017   ALT 12 07/16/2017   AST 17 07/16/2017   NA 138 06/17/2017   K 3.6 06/17/2017   CL 97 06/17/2017   CREATININE 0.99 06/17/2017   BUN 16 06/17/2017   CO2 31 06/17/2017   TSH 2.04 04/11/2016   PSA 1.01 06/17/2017    Dg Cervical Spine Complete  Result Date: 06/17/2017 CLINICAL DATA:  51 year old male with right-sided neck pain for 6 months. No known injury. Initial encounter. EXAM: CERVICAL SPINE - COMPLETE 4+ VIEW COMPARISON:  None. FINDINGS: Straightening of the cervical spine. No significant disc space narrowing. Very mild C4-5 and C5-6 facet degenerative changes and uncinate hypertrophy. Minimal right-sided C4-5 and C5-6 foraminal narrowing. No fracture or abnormal prevertebral soft tissue swelling. Lung apices are clear. IMPRESSION: Straightening of the cervical spine. No significant disc space narrowing. Very mild C4-5 and C5-6 facet degenerative changes and uncinate hypertrophy. Minimal right-sided C4-5 and C5-6 foraminal narrowing. Electronically Signed   By: Lacy Duverney M.D.   On: 06/17/2017 20:05    Assessment & Plan:   Collin Horne was seen today for rash.  Diagnoses and all  orders for this visit:  Pure hyperglyceridemia- his triglyceride level is mildly elevated but does not require medical therapy. -     Lipid panel; Future  Pain due to onychomycosis of toenails of both feet- he is tolerating terbinafine well and his liver enzymes are normal, will continue for an additional 3 months. -     Hepatic function panel; Future -     terbinafine (LAMISIL) 250 MG tablet; Take 1 tablet (250 mg total) by mouth daily.  Alcohol abuse, in remission- will change injectable naltrexone to the less expensive oral naltrexone. -     naltrexone (DEPADE) 50 MG tablet; Take 1 tablet (50 mg total) by mouth daily.  Eczema, unspecified type -     fluocinonide ointment (LIDEX) 0.05 %; Apply 1 application topically 2 (two) times daily.   I am having Mr. Garringer start on naltrexone and fluocinonide ointment. I am also having him maintain his sildenafil, gabapentin, PARoxetine, ARIPiprazole, CHANTIX, ciclopirox, desonide, and terbinafine.  Meds ordered this encounter  Medications  . naltrexone (DEPADE) 50 MG tablet    Sig: Take 1 tablet (50 mg total) by mouth daily.    Dispense:  90 tablet    Refill:  1  . terbinafine (LAMISIL) 250 MG tablet    Sig: Take 1 tablet (250 mg total) by mouth daily.    Dispense:  90 tablet    Refill:  0  . fluocinonide ointment (LIDEX) 0.05 %    Sig: Apply 1 application topically 2 (two) times daily.    Dispense:  30 g    Refill:  0     Follow-up: Return in about 3 months (around 10/15/2017).  Sanda Lingerhomas Lizann Edelman, MD

## 2017-07-16 NOTE — Patient Instructions (Signed)

## 2017-07-17 MED ORDER — FLUOCINONIDE 0.05 % EX OINT: 1.0000 "application " | TOPICAL_OINTMENT | Freq: Two times a day (BID) | CUTANEOUS | 0 refills | Status: DC

## 2017-08-13 ENCOUNTER — Encounter: Payer: Self-pay | Admitting: Internal Medicine

## 2017-10-25 NOTE — Telephone Encounter (Signed)
08/20/2017: Cancelled - Suspended for Inactivity  

## 2018-07-08 DIAGNOSIS — F1020 Alcohol dependence, uncomplicated: Secondary | ICD-10-CM | POA: Insufficient documentation

## 2018-07-08 DIAGNOSIS — F331 Major depressive disorder, recurrent, moderate: Secondary | ICD-10-CM | POA: Insufficient documentation

## 2018-09-02 ENCOUNTER — Ambulatory Visit (INDEPENDENT_AMBULATORY_CARE_PROVIDER_SITE_OTHER): Payer: BLUE CROSS/BLUE SHIELD | Admitting: Internal Medicine

## 2018-09-02 ENCOUNTER — Encounter: Payer: Self-pay | Admitting: Internal Medicine

## 2018-09-02 ENCOUNTER — Other Ambulatory Visit (INDEPENDENT_AMBULATORY_CARE_PROVIDER_SITE_OTHER): Payer: BLUE CROSS/BLUE SHIELD

## 2018-09-02 VITALS — BP 106/70 | HR 71 | Temp 98.1°F | Ht 66.0 in | Wt 168.2 lb

## 2018-09-02 DIAGNOSIS — Z720 Tobacco use: Secondary | ICD-10-CM

## 2018-09-02 DIAGNOSIS — D539 Nutritional anemia, unspecified: Secondary | ICD-10-CM

## 2018-09-02 DIAGNOSIS — Z1212 Encounter for screening for malignant neoplasm of rectum: Secondary | ICD-10-CM

## 2018-09-02 DIAGNOSIS — N522 Drug-induced erectile dysfunction: Secondary | ICD-10-CM | POA: Diagnosis not present

## 2018-09-02 DIAGNOSIS — Z0000 Encounter for general adult medical examination without abnormal findings: Secondary | ICD-10-CM

## 2018-09-02 DIAGNOSIS — Z1211 Encounter for screening for malignant neoplasm of colon: Secondary | ICD-10-CM

## 2018-09-02 DIAGNOSIS — Z23 Encounter for immunization: Secondary | ICD-10-CM | POA: Diagnosis not present

## 2018-09-02 DIAGNOSIS — F1029 Alcohol dependence with unspecified alcohol-induced disorder: Secondary | ICD-10-CM | POA: Insufficient documentation

## 2018-09-02 DIAGNOSIS — D510 Vitamin B12 deficiency anemia due to intrinsic factor deficiency: Secondary | ICD-10-CM

## 2018-09-02 LAB — COMPREHENSIVE METABOLIC PANEL
ALBUMIN: 4.4 g/dL (ref 3.5–5.2)
ALK PHOS: 66 U/L (ref 39–117)
ALT: 27 U/L (ref 0–53)
AST: 19 U/L (ref 0–37)
BUN: 6 mg/dL (ref 6–23)
CHLORIDE: 102 meq/L (ref 96–112)
CO2: 33 mEq/L — ABNORMAL HIGH (ref 19–32)
Calcium: 9.4 mg/dL (ref 8.4–10.5)
Creatinine, Ser: 0.96 mg/dL (ref 0.40–1.50)
GFR: 87.42 mL/min (ref >60.00)
Glucose, Bld: 92 mg/dL (ref 70–99)
POTASSIUM: 4 meq/L (ref 3.5–5.1)
SODIUM: 142 meq/L (ref 135–145)
Total Bilirubin: 0.4 mg/dL (ref 0.2–1.2)
Total Protein: 7.2 g/dL (ref 6.0–8.3)

## 2018-09-02 LAB — CBC WITH DIFFERENTIAL/PLATELET
Basophils Absolute: 0.1 10*3/uL (ref 0.0–0.1)
Basophils Relative: 1.3 % (ref 0.0–3.0)
Eosinophils Absolute: 0.2 10*3/uL (ref 0.0–0.7)
Eosinophils Relative: 2.6 % (ref 0.0–5.0)
HEMATOCRIT: 37.9 % — AB (ref 39.0–52.0)
HEMOGLOBIN: 12.9 g/dL — AB (ref 13.0–17.0)
LYMPHS PCT: 44.3 % (ref 12.0–46.0)
Lymphs Abs: 2.9 10*3/uL (ref 0.7–4.0)
MCHC: 34.1 g/dL (ref 30.0–36.0)
MCV: 98.2 fl (ref 78.0–100.0)
MONOS PCT: 8.7 % (ref 3.0–12.0)
Monocytes Absolute: 0.6 10*3/uL (ref 0.1–1.0)
Neutro Abs: 2.8 10*3/uL (ref 1.4–7.7)
Neutrophils Relative %: 43.1 % (ref 43.0–77.0)
Platelets: 375 10*3/uL (ref 150.0–400.0)
RBC: 3.85 Mil/uL — AB (ref 4.22–5.81)
RDW: 13.7 % (ref 11.5–15.5)
WBC: 6.5 10*3/uL (ref 4.0–10.5)

## 2018-09-02 LAB — LIPID PANEL
CHOL/HDL RATIO: 4
CHOLESTEROL: 165 mg/dL (ref 0–200)
HDL: 41.2 mg/dL (ref >39.00)
NonHDL: 123.96
TRIGLYCERIDES: 300 mg/dL — AB (ref 0.0–149.0)
VLDL: 60 mg/dL — AB (ref 0.0–40.0)

## 2018-09-02 LAB — IBC PANEL
Iron: 105 ug/dL (ref 42–165)
Saturation Ratios: 25.5 % (ref 20.0–50.0)
Transferrin: 294 mg/dL (ref 212.0–360.0)

## 2018-09-02 LAB — LDL CHOLESTEROL, DIRECT: LDL DIRECT: 92 mg/dL

## 2018-09-02 MED ORDER — VARENICLINE TARTRATE 0.5 MG X 11 & 1 MG X 42 PO MISC: ORAL | 0 refills | Status: DC

## 2018-09-02 MED ORDER — VARENICLINE TARTRATE 1 MG PO TABS: 1.0000 mg | ORAL_TABLET | Freq: Two times a day (BID) | ORAL | 3 refills | Status: DC

## 2018-09-02 MED ORDER — SILDENAFIL CITRATE 50 MG PO TABS: 50.0000 mg | ORAL_TABLET | Freq: Every day | ORAL | 2 refills | Status: DC | PRN

## 2018-09-02 NOTE — Patient Instructions (Signed)

## 2018-09-02 NOTE — Progress Notes (Signed)
Subjective:  Patient ID: Collin Horne, male    DOB: April 12, 1966  Age: 52 y.o. MRN: [id number]  CC: Anemia and Annual Exam   HPI Collin Horne presents for a CPX.  He tells me that 2 months he went to [facility] for a 5-day detox from alcohol.  He has felt much better since then but unfortunately has been drinking over the last 3 days.  He tells me he is consuming about 1 many bottle of liquor a day.  He continues to see a therapist at [facility] and a nurse practitioner who is prescribing some psychiatric meds.  Complains of mild numbness in both feet but otherwise feels well and offers no other complaints today.  Outpatient Medications Prior to Visit  Medication Sig Dispense Refill  . acamprosate (CAMPRAL) 333 MG tablet TAKE 2 TABLETS BY MOUTH 3 TIMES DAILY  1  . ARIPiprazole (ABILIFY) 5 MG tablet Take 5 mg by mouth daily.  0  . gabapentin (NEURONTIN) 600 MG tablet 3 (three) times daily before meals.    . temazepam (RESTORIL) 15 MG capsule TK 1 C PO QD HS  0  . ALPRAZolam (XANAX) 0.5 MG tablet Take 0.5 mg by mouth 2 (two) times daily as needed. for anxiety  0  . CHANTIX 1 MG tablet   0  . gabapentin (NEURONTIN) 300 MG capsule Take 2 capsules (600 mg total) by mouth 3 (three) times daily. 540 capsule 1  . sildenafil (VIAGRA) 50 MG tablet Take 1 tablet (50 mg total) by mouth daily as needed for erectile dysfunction. 10 tablet 2  . ciclopirox (LOPROX) 0.77 % cream Apply topically 2 (two) times daily. 15 g 1  . desonide (DESOWEN) 0.05 % lotion Apply topically 2 (two) times daily. 59 mL 2  . fluocinonide ointment (LIDEX) 0.05 % Apply 1 application topically 2 (two) times daily. 30 g 0  . naltrexone (DEPADE) 50 MG tablet Take 1 tablet (50 mg total) by mouth daily. 90 tablet 1  . PARoxetine (PAXIL) 20 MG tablet Take 3 tablets (60 mg total) by mouth daily. (Patient taking differently: Take 60 mg by mouth daily. Patient states that he take a 30mg  tablet daily.) 270 tablet 3    No facility-administered medications prior to visit.     ROS Review of Systems  Constitutional: Negative for appetite change, diaphoresis, fatigue and unexpected weight change.  HENT: Negative.   Eyes: Negative for visual disturbance.  Respiratory: Negative for cough, chest tightness, shortness of breath and wheezing.   Cardiovascular: Negative for chest pain, palpitations and leg swelling.  Gastrointestinal: Negative for abdominal pain, blood in stool, diarrhea and nausea.  Endocrine: Negative.   Genitourinary: Negative.  Negative for difficulty urinating, scrotal swelling, testicular pain and urgency.       +ED  Musculoskeletal: Negative.  Negative for arthralgias and myalgias.  Skin: Negative.   Allergic/Immunologic: Negative.   Neurological: Positive for numbness. Negative for dizziness, weakness and headaches.  Hematological: Negative for adenopathy. Does not bruise/bleed easily.  Psychiatric/Behavioral: Positive for sleep disturbance. Negative for behavioral problems, decreased concentration, dysphoric mood, self-injury and suicidal ideas. The patient is nervous/anxious.     Objective:  BP 106/70 (BP Location: Left Arm, Patient Position: Sitting, Cuff Size: Normal)   Pulse 71   Temp 98.1 F (36.7 C) (Oral)   Ht 5\' 6"  (1.676 m)   Wt 168 lb 4 oz (76.3 kg)   SpO2 97%   BMI 27.16 kg/m   BP Readings from Last  3 Encounters:  09/02/18 106/70  07/16/17 112/76  06/17/17 110/62    Wt Readings from Last 3 Encounters:  09/02/18 168 lb 4 oz (76.3 kg)  07/16/17 156 lb (70.8 kg)  06/17/17 158 lb 4 oz (71.8 kg)    Physical Exam  Constitutional: He is oriented to person, place, and time. No distress.  HENT:  Mouth/Throat: Oropharynx is clear and moist. No oropharyngeal exudate.  Eyes: Conjunctivae are normal. No scleral icterus.  Neck: Normal range of motion. Neck supple. No JVD present. No thyromegaly present.  Cardiovascular: Normal rate, regular rhythm and normal heart  sounds. Exam reveals no gallop.  No murmur heard. Pulmonary/Chest: Effort normal and breath sounds normal. No respiratory distress. He has no wheezes. He has no rales.  Abdominal: Soft. Bowel sounds are normal. He exhibits no mass. There is no hepatosplenomegaly. There is no tenderness. Hernia confirmed negative in the right inguinal area and confirmed negative in the left inguinal area.  Genitourinary: Rectum normal, prostate normal, testes normal and penis normal. Rectal exam shows no external hemorrhoid, no internal hemorrhoid, no fissure, no mass, no tenderness, anal tone normal and guaiac negative stool. Prostate is not enlarged and not tender. Right testis shows no mass, no swelling and no tenderness. Left testis shows no mass, no swelling and no tenderness. Circumcised. No penile erythema or penile tenderness. No discharge found.  Musculoskeletal: Normal range of motion. He exhibits no edema, tenderness or deformity.  Lymphadenopathy:    He has no cervical adenopathy. No inguinal adenopathy noted on the right or left side.  Neurological: He is alert and oriented to person, place, and time.  Skin: Skin is warm and dry. No rash noted. He is not diaphoretic.  Psychiatric: He has a normal mood and affect. His behavior is normal. Judgment and thought content normal.  Vitals reviewed.   Lab Results  Component Value Date   WBC 6.5 09/02/2018   HGB 12.9 (L) 09/02/2018   HCT 37.9 (L) 09/02/2018   PLT 375.0 09/02/2018   GLUCOSE 92 09/02/2018   CHOL 165 09/02/2018   TRIG 300.0 (H) 09/02/2018   HDL 41.20 09/02/2018   LDLDIRECT 92.0 09/02/2018   LDLCALC 112 (H) 07/16/2017   ALT 27 09/02/2018   AST 19 09/02/2018   NA 142 09/02/2018   K 4.0 09/02/2018   CL 102 09/02/2018   CREATININE 0.96 09/02/2018   BUN 6 09/02/2018   CO2 33 (H) 09/02/2018   TSH 2.04 04/11/2016   PSA 1.18 09/02/2018    Dg Cervical Spine Complete  Result Date: 06/17/2017 CLINICAL DATA:  52 year old male with  right-sided neck pain for 6 months. No known injury. Initial encounter. EXAM: CERVICAL SPINE - COMPLETE 4+ VIEW COMPARISON:  None. FINDINGS: Straightening of the cervical spine. No significant disc space narrowing. Very mild C4-5 and C5-6 facet degenerative changes and uncinate hypertrophy. Minimal right-sided C4-5 and C5-6 foraminal narrowing. No fracture or abnormal prevertebral soft tissue swelling. Lung apices are clear. IMPRESSION: Straightening of the cervical spine. No significant disc space narrowing. Very mild C4-5 and C5-6 facet degenerative changes and uncinate hypertrophy. Minimal right-sided C4-5 and C5-6 foraminal narrowing. Electronically Signed   By: Lacy Duverney M.D.   On: 06/17/2017 20:05    Assessment & Plan:   Collin Horne was seen today for anemia and annual exam.  Diagnoses and all orders for this visit:  Need for influenza vaccination -     Flu Vaccine QUAD 36+ mos IM  Need for pneumococcal vaccination -  Pneumococcal polysaccharide vaccine 23-valent greater than or equal to 2yo subcutaneous/IM  Colon cancer screening -     Cologuard  Screening for malignant neoplasm of the rectum -     Cologuard  Drug-induced erectile dysfunction -     sildenafil (VIAGRA) 50 MG tablet; Take 1 tablet (50 mg total) by mouth daily as needed for erectile dysfunction.  Deficiency anemia- His H&H remain low.  Iron level is normal.  He has B12 deficiency.  I will monitor his vitamin B1 level. -     CBC with Differential/Platelet; Future -     Vitamin B1; Future -     Folate; Future -     Ferritin; Future -     Vitamin B12; Future -     IBC panel; Future  Routine general medical examination at a health care facility-Sam completed, labs reviewed, vaccines reviewed and updated, Cologuard ordered to screen for colon cancer, patient education material was given. -     Lipid panel; Future -     PSA; Future  Tobacco abuse- He is ready to quit smoking.  Will give Chantix a try. -      varenicline (CHANTIX STARTING MONTH PAK) 0.5 MG X 11 & 1 MG X 42 tablet; Take one 0.5 mg tablet by mouth once daily for 3 days, then increase to one 0.5 mg tablet twice daily for 4 days, then increase to one 1 mg tablet twice daily. -     varenicline (CHANTIX CONTINUING MONTH PAK) 1 MG tablet; Take 1 tablet (1 mg total) by mouth 2 (two) times daily.  Alcohol dependence with unspecified alcohol-induced disorder (HCC) - I have encouraged him to start attending AA meetings and to get an AA sponsor. -     Lipid panel; Future -     Comprehensive metabolic panel; Future  Vitamin B12 deficiency anemia due to intrinsic factor deficiency- Will start parenteral B12 replacement therapy.   I have discontinued Brevin Madole's PARoxetine, CHANTIX, ciclopirox, desonide, naltrexone, fluocinonide ointment, and ALPRAZolam. I am also having him start on varenicline and varenicline. Additionally, I am having him maintain his ARIPiprazole, acamprosate, gabapentin, temazepam, and sildenafil.  Meds ordered this encounter  Medications  . sildenafil (VIAGRA) 50 MG tablet    Sig: Take 1 tablet (50 mg total) by mouth daily as needed for erectile dysfunction.    Dispense:  10 tablet    Refill:  2  . varenicline (CHANTIX STARTING MONTH PAK) 0.5 MG X 11 & 1 MG X 42 tablet    Sig: Take one 0.5 mg tablet by mouth once daily for 3 days, then increase to one 0.5 mg tablet twice daily for 4 days, then increase to one 1 mg tablet twice daily.    Dispense:  53 tablet    Refill:  0  . varenicline (CHANTIX CONTINUING MONTH PAK) 1 MG tablet    Sig: Take 1 tablet (1 mg total) by mouth 2 (two) times daily.    Dispense:  60 tablet    Refill:  3     Follow-up: Return in about 6 months (around 03/04/2019).  Sanda Linger, MD

## 2018-09-03 ENCOUNTER — Encounter: Payer: Self-pay | Admitting: Internal Medicine

## 2018-09-03 DIAGNOSIS — D510 Vitamin B12 deficiency anemia due to intrinsic factor deficiency: Secondary | ICD-10-CM | POA: Insufficient documentation

## 2018-09-03 LAB — PSA: PSA: 1.18 ng/mL (ref 0.10–4.00)

## 2018-09-03 LAB — VITAMIN B12: Vitamin B-12: 191 pg/mL — ABNORMAL LOW (ref 211–911)

## 2018-09-03 LAB — FERRITIN: Ferritin: 63.5 ng/mL (ref 22.0–322.0)

## 2018-09-03 LAB — FOLATE: FOLATE: 11.3 ng/mL (ref >5.9)

## 2018-09-04 ENCOUNTER — Ambulatory Visit (INDEPENDENT_AMBULATORY_CARE_PROVIDER_SITE_OTHER): Payer: BLUE CROSS/BLUE SHIELD

## 2018-09-04 DIAGNOSIS — E538 Deficiency of other specified B group vitamins: Secondary | ICD-10-CM | POA: Diagnosis not present

## 2018-09-04 MED ORDER — CYANOCOBALAMIN 1000 MCG/ML IJ SOLN
1000.0000 ug | Freq: Once | INTRAMUSCULAR | Status: AC
  Administered 2018-09-04: 1000 ug via INTRAMUSCULAR

## 2018-09-04 NOTE — Progress Notes (Signed)
I have reviewed and agree.

## 2018-09-05 ENCOUNTER — Encounter: Payer: Self-pay | Admitting: Internal Medicine

## 2018-09-06 LAB — VITAMIN B1: VITAMIN B1 (THIAMINE): 20 nmol/L (ref 8–30)

## 2018-10-06 ENCOUNTER — Ambulatory Visit (INDEPENDENT_AMBULATORY_CARE_PROVIDER_SITE_OTHER): Payer: BLUE CROSS/BLUE SHIELD | Admitting: Emergency Medicine

## 2018-10-06 ENCOUNTER — Telehealth: Payer: Self-pay | Admitting: Emergency Medicine

## 2018-10-06 DIAGNOSIS — D510 Vitamin B12 deficiency anemia due to intrinsic factor deficiency: Secondary | ICD-10-CM | POA: Diagnosis not present

## 2018-10-06 MED ORDER — CYANOCOBALAMIN 1000 MCG/ML IJ SOLN
1000.0000 ug | Freq: Once | INTRAMUSCULAR | Status: AC
  Administered 2018-10-06: 1000 ug via INTRAMUSCULAR

## 2018-10-06 NOTE — Telephone Encounter (Signed)
LVM for pt to call back and discuss. Stef I will not be here tomorrow, please let pt know if he is to call back.

## 2018-10-06 NOTE — Telephone Encounter (Signed)
Pt informed and is okay with staying with the injections.

## 2018-10-06 NOTE — Telephone Encounter (Signed)
He can do a B12 shot once a month indefinitely or I can prescribe a high-dose oral supplement and he can come back in 3 months to check his level.  Ask him to let me know which option he wants to pursue.

## 2018-10-06 NOTE — Progress Notes (Signed)
I have reviewed and agree.

## 2018-10-06 NOTE — Telephone Encounter (Signed)
Pt came in for B12 injection today. Would like to know how long he will be getting injections and if he needs to come back for repeat blood work. Please advise.

## 2018-11-11 ENCOUNTER — Ambulatory Visit (INDEPENDENT_AMBULATORY_CARE_PROVIDER_SITE_OTHER): Payer: BLUE CROSS/BLUE SHIELD

## 2018-11-11 DIAGNOSIS — E538 Deficiency of other specified B group vitamins: Secondary | ICD-10-CM | POA: Diagnosis not present

## 2018-11-11 MED ORDER — CYANOCOBALAMIN 1000 MCG/ML IJ SOLN
1000.0000 ug | Freq: Once | INTRAMUSCULAR | Status: AC
  Administered 2018-11-11: 1000 ug via INTRAMUSCULAR

## 2018-11-11 NOTE — Progress Notes (Signed)
I have reviewed and agree.

## 2018-11-13 ENCOUNTER — Ambulatory Visit: Payer: BLUE CROSS/BLUE SHIELD

## 2019-01-15 ENCOUNTER — Other Ambulatory Visit: Payer: Self-pay | Admitting: Psychiatry

## 2019-01-15 NOTE — Telephone Encounter (Signed)
Need to review chart not seen in epic 

## 2019-01-16 NOTE — Telephone Encounter (Signed)
Not been seen since 02/2018 no office visit scheduled   Ok to fill?

## 2019-01-16 NOTE — Telephone Encounter (Signed)
Needs appointment

## 2020-02-05 DIAGNOSIS — H2513 Age-related nuclear cataract, bilateral: Secondary | ICD-10-CM | POA: Insufficient documentation

## 2020-02-21 LAB — EXTERNAL GENERIC LAB PROCEDURE: COLOGUARD: NEGATIVE

## 2020-02-21 LAB — COLOGUARD: COLOGUARD: NEGATIVE

## 2021-12-21 ENCOUNTER — Ambulatory Visit (INDEPENDENT_AMBULATORY_CARE_PROVIDER_SITE_OTHER): Payer: 59 | Admitting: Internal Medicine

## 2021-12-21 ENCOUNTER — Other Ambulatory Visit: Payer: Self-pay

## 2021-12-21 ENCOUNTER — Encounter: Payer: Self-pay | Admitting: Internal Medicine

## 2021-12-21 VITALS — BP 122/82 | HR 90 | Temp 98.4°F | Resp 16 | Ht 66.0 in | Wt 158.0 lb

## 2021-12-21 DIAGNOSIS — D510 Vitamin B12 deficiency anemia due to intrinsic factor deficiency: Secondary | ICD-10-CM

## 2021-12-21 DIAGNOSIS — R5382 Chronic fatigue, unspecified: Secondary | ICD-10-CM

## 2021-12-21 DIAGNOSIS — R531 Weakness: Secondary | ICD-10-CM | POA: Insufficient documentation

## 2021-12-21 DIAGNOSIS — Z0001 Encounter for general adult medical examination with abnormal findings: Secondary | ICD-10-CM

## 2021-12-21 DIAGNOSIS — Z1211 Encounter for screening for malignant neoplasm of colon: Secondary | ICD-10-CM | POA: Insufficient documentation

## 2021-12-21 DIAGNOSIS — Z720 Tobacco use: Secondary | ICD-10-CM

## 2021-12-21 DIAGNOSIS — Z1159 Encounter for screening for other viral diseases: Secondary | ICD-10-CM | POA: Insufficient documentation

## 2021-12-21 DIAGNOSIS — E781 Pure hyperglyceridemia: Secondary | ICD-10-CM

## 2021-12-21 DIAGNOSIS — Z125 Encounter for screening for malignant neoplasm of prostate: Secondary | ICD-10-CM | POA: Insufficient documentation

## 2021-12-21 LAB — HEPATIC FUNCTION PANEL
ALT: 11 U/L (ref 0–53)
AST: 14 U/L (ref 0–37)
Albumin: 4.3 g/dL (ref 3.5–5.2)
Alkaline Phosphatase: 61 U/L (ref 39–117)
Bilirubin, Direct: 0.1 mg/dL (ref 0.0–0.3)
Total Bilirubin: 0.4 mg/dL (ref 0.2–1.2)
Total Protein: 7.5 g/dL (ref 6.0–8.3)

## 2021-12-21 LAB — BASIC METABOLIC PANEL
BUN: 4 mg/dL — ABNORMAL LOW (ref 6–23)
CO2: 29 mEq/L (ref 19–32)
Calcium: 9.5 mg/dL (ref 8.4–10.5)
Chloride: 102 mEq/L (ref 96–112)
Creatinine, Ser: 1.12 mg/dL (ref 0.40–1.50)
GFR: 74.06 mL/min (ref >60.00)
Glucose, Bld: 103 mg/dL — ABNORMAL HIGH (ref 70–99)
Potassium: 4.2 mEq/L (ref 3.5–5.1)
Sodium: 139 mEq/L (ref 135–145)

## 2021-12-21 LAB — CBC WITH DIFFERENTIAL/PLATELET
Basophils Absolute: 0.1 10*3/uL (ref 0.0–0.1)
Basophils Relative: 1 % (ref 0.0–3.0)
Eosinophils Absolute: 0.2 10*3/uL (ref 0.0–0.7)
Eosinophils Relative: 2.8 % (ref 0.0–5.0)
HCT: 40.4 % (ref 39.0–52.0)
Hemoglobin: 13.4 g/dL (ref 13.0–17.0)
Lymphocytes Relative: 36 % (ref 12.0–46.0)
Lymphs Abs: 3 10*3/uL (ref 0.7–4.0)
MCHC: 33.2 g/dL (ref 30.0–36.0)
MCV: 91 fl (ref 78.0–100.0)
Monocytes Absolute: 0.6 10*3/uL (ref 0.1–1.0)
Monocytes Relative: 7.5 % (ref 3.0–12.0)
Neutro Abs: 4.4 10*3/uL (ref 1.4–7.7)
Neutrophils Relative %: 52.7 % (ref 43.0–77.0)
Platelets: 341 10*3/uL (ref 150.0–400.0)
RBC: 4.44 Mil/uL (ref 4.22–5.81)
RDW: 14.3 % (ref 11.5–15.5)
WBC: 8.3 10*3/uL (ref 4.0–10.5)

## 2021-12-21 LAB — LIPID PANEL
Cholesterol: 151 mg/dL (ref 0–200)
HDL: 55.9 mg/dL (ref >39.00)
NonHDL: 94.7
Total CHOL/HDL Ratio: 3
Triglycerides: 202 mg/dL — ABNORMAL HIGH (ref 0.0–149.0)
VLDL: 40.4 mg/dL — ABNORMAL HIGH (ref 0.0–40.0)

## 2021-12-21 LAB — TSH: TSH: 0.55 u[IU]/mL (ref 0.35–5.50)

## 2021-12-21 LAB — VITAMIN B12: Vitamin B-12: 204 pg/mL — ABNORMAL LOW (ref 211–911)

## 2021-12-21 LAB — PSA: PSA: 1.32 ng/mL (ref 0.10–4.00)

## 2021-12-21 LAB — LDL CHOLESTEROL, DIRECT: Direct LDL: 70 mg/dL

## 2021-12-21 LAB — FOLATE: Folate: 6.6 ng/mL (ref >5.9)

## 2021-12-21 NOTE — Patient Instructions (Signed)

## 2021-12-21 NOTE — Progress Notes (Signed)
Subjective:  Patient ID: Collin Horne, male    DOB: Oct 03, 1966  Age: 56 y.o. MRN: [id number]  CC: Annual Exam and Anemia  This visit occurred during the SARS-CoV-2 public health emergency.  Safety protocols were in place, including screening questions prior to the visit, additional usage of staff PPE, and extensive cleaning of exam room while observing appropriate contact time as indicated for disinfecting solutions.    HPI Collin Horne presents for a CPX and f/up -   He has abstained from alcohol for 7 months.  He has a history of B12 deficiency but is not currently receiving a B12 supplement.  He complains of numbness in his left hand.  He is active and denies chest pain, shortness of breath, diaphoresis, dizziness, lightheadedness, or edema.  He complains of chronic fatigue.  Outpatient Medications Prior to Visit  Medication Sig Dispense Refill   gabapentin (NEURONTIN) 600 MG tablet 3 (three) times daily before meals.     acamprosate (CAMPRAL) 333 MG tablet TAKE 2 TABLETS BY MOUTH 3 TIMES DAILY 540 tablet 0   ARIPiprazole (ABILIFY) 5 MG tablet Take 5 mg by mouth daily.  0   sildenafil (VIAGRA) 50 MG tablet Take 1 tablet (50 mg total) by mouth daily as needed for erectile dysfunction. 10 tablet 2   temazepam (RESTORIL) 15 MG capsule TK 1 C PO QD HS  0   varenicline (CHANTIX CONTINUING MONTH PAK) 1 MG tablet Take 1 tablet (1 mg total) by mouth 2 (two) times daily. 60 tablet 3   varenicline (CHANTIX STARTING MONTH PAK) 0.5 MG X 11 & 1 MG X 42 tablet Take one 0.5 mg tablet by mouth once daily for 3 days, then increase to one 0.5 mg tablet twice daily for 4 days, then increase to one 1 mg tablet twice daily. 53 tablet 0   No facility-administered medications prior to visit.    ROS Review of Systems  Constitutional:  Positive for fatigue. Negative for chills, diaphoresis and fever.  HENT: Negative.    Eyes: Negative.   Respiratory:  Negative for cough, shortness of breath and  wheezing.   Cardiovascular:  Negative for chest pain, palpitations and leg swelling.  Gastrointestinal:  Negative for abdominal pain, constipation, diarrhea, nausea and vomiting.  Endocrine: Negative.   Genitourinary: Negative.  Negative for difficulty urinating.  Musculoskeletal:  Negative for arthralgias and myalgias.  Skin: Negative.  Negative for color change and pallor.  Neurological:  Positive for numbness. Negative for dizziness, seizures, weakness and light-headedness.  Hematological:  Negative for adenopathy. Does not bruise/bleed easily.  Psychiatric/Behavioral: Negative.     Objective:  BP 122/82 (BP Location: Right Arm, Patient Position: Sitting, Cuff Size: Large)    Pulse (!) 104    Temp 98.4 F (36.9 C) (Oral)    Ht 5\' 6"  (1.676 m)    Wt 158 lb (71.7 kg)    SpO2 94%    BMI 25.50 kg/m   BP Readings from Last 3 Encounters:  12/21/21 122/82  09/02/18 106/70  07/16/17 112/76    Wt Readings from Last 3 Encounters:  12/21/21 158 lb (71.7 kg)  09/02/18 168 lb 4 oz (76.3 kg)  07/16/17 156 lb (70.8 kg)    Physical Exam Vitals reviewed.  Constitutional:      Appearance: Normal appearance.  HENT:     Nose: Nose normal.     Mouth/Throat:     Mouth: Mucous membranes are moist.  Eyes:     General: No scleral icterus.  Conjunctiva/sclera: Conjunctivae normal.  Cardiovascular:     Rate and Rhythm: Normal rate and regular rhythm.     Heart sounds: No murmur heard. Pulmonary:     Effort: Pulmonary effort is normal.     Breath sounds: No stridor. No wheezing, rhonchi or rales.  Abdominal:     General: Abdomen is flat.     Palpations: There is no mass.     Tenderness: There is no abdominal tenderness. There is no guarding or rebound.     Hernia: No hernia is present. There is no hernia in the left inguinal area or right inguinal area.  Genitourinary:    Pubic Area: No rash.      Penis: Normal and circumcised.      Testes: Normal.        Right: Mass, tenderness or  swelling not present.        Left: Mass, tenderness or swelling not present.     Epididymis:     Right: Normal. Not inflamed or enlarged. No mass.     Left: Normal. Not inflamed or enlarged. No mass.     Prostate: Normal. Not enlarged, not tender and no nodules present.     Rectum: Normal. Guaiac result negative. No mass, tenderness, anal fissure, external hemorrhoid or internal hemorrhoid. Normal anal tone.  Musculoskeletal:     Cervical back: Neck supple.  Lymphadenopathy:     Cervical: No cervical adenopathy.     Lower Body: No right inguinal adenopathy. No left inguinal adenopathy.  Neurological:     Mental Status: He is alert.    Lab Results  Component Value Date   WBC 8.3 12/21/2021   HGB 13.4 12/21/2021   HCT 40.4 12/21/2021   PLT 341.0 12/21/2021   GLUCOSE 103 (H) 12/21/2021   CHOL 151 12/21/2021   TRIG 202.0 (H) 12/21/2021   HDL 55.90 12/21/2021   LDLDIRECT 70.0 12/21/2021   LDLCALC 112 (H) 07/16/2017   ALT 11 12/21/2021   AST 14 12/21/2021   NA 139 12/21/2021   K 4.2 12/21/2021   CL 102 12/21/2021   CREATININE 1.12 12/21/2021   BUN 4 (L) 12/21/2021   CO2 29 12/21/2021   TSH 0.55 12/21/2021   PSA 1.32 12/21/2021    DG Cervical Spine Complete  Result Date: 06/17/2017 CLINICAL DATA:  56 year old male with right-sided neck pain for 6 months. No known injury. Initial encounter. EXAM: CERVICAL SPINE - COMPLETE 4+ VIEW COMPARISON:  None. FINDINGS: Straightening of the cervical spine. No significant disc space narrowing. Very mild C4-5 and C5-6 facet degenerative changes and uncinate hypertrophy. Minimal right-sided C4-5 and C5-6 foraminal narrowing. No fracture or abnormal prevertebral soft tissue swelling. Lung apices are clear. IMPRESSION: Straightening of the cervical spine. No significant disc space narrowing. Very mild C4-5 and C5-6 facet degenerative changes and uncinate hypertrophy. Minimal right-sided C4-5 and C5-6 foraminal narrowing. Electronically Signed   By:  Lacy Duverney M.D.   On: 06/17/2017 20:05    Assessment & Plan:   Taryll was seen today for annual exam and anemia.  Diagnoses and all orders for this visit:  Need for hepatitis C screening test -     Hepatitis C antibody; Future -     Hepatitis C antibody  Tobacco abuse -     Ambulatory Referral for Lung Cancer Scre  Vitamin B12 deficiency anemia due to intrinsic factor deficiency- I have asked him to start parenteral B12 replacement therapy. -     CBC with Differential/Platelet; Future -  Vitamin B12; Future -     Folate; Future -     Folate -     Vitamin B12 -     CBC with Differential/Platelet  Encounter for general adult medical examination with abnormal findings- Exam completed, labs reviewed-statin tx is not indicated, vaccines reviewed and are up-to-date, cancer screenings addressed, patient education material was given. -     Lipid panel; Future -     PSA; Future -     PSA -     Lipid panel  Chronic fatigue- Labs are negative for secondary causes. -     Basic metabolic panel; Future -     Hepatic function panel; Future -     TSH; Future -     Testosterone Total,Free,Bio, Males; Future -     Testosterone Total,Free,Bio, Males -     TSH -     Hepatic function panel -     Basic metabolic panel  Screen for colon cancer -     Cologuard  Other orders -     LDL cholesterol, direct   I have discontinued Jayvian Touchet "Ray"'s ARIPiprazole, temazepam, sildenafil, varenicline, varenicline, and acamprosate. I am also having him maintain his gabapentin.  No orders of the defined types were placed in this encounter.    Follow-up: Return in about 6 months (around 06/20/2022).  Sanda Linger, MD

## 2021-12-22 ENCOUNTER — Telehealth: Payer: Self-pay | Admitting: Internal Medicine

## 2021-12-22 LAB — TESTOSTERONE TOTAL,FREE,BIO, MALES
Albumin: 4.6 g/dL (ref 3.6–5.1)
Sex Hormone Binding: 51 nmol/L — ABNORMAL HIGH (ref 10–50)
Testosterone, Bioavailable: 96.1 ng/dL — ABNORMAL LOW (ref 110.0–575.0)
Testosterone, Free: 45.8 pg/mL — ABNORMAL LOW (ref 46.0–224.0)
Testosterone: 499 ng/dL (ref 250–827)

## 2021-12-22 LAB — HEPATITIS C ANTIBODY
Hepatitis C Ab: NONREACTIVE
SIGNAL TO CUT-OFF: <0.02 (ref <1.00)

## 2021-12-22 NOTE — Telephone Encounter (Signed)
Pt returning call regarding 12-21-2021 lab results, informed pt of provider's 12-21-2021 result note recommendation, Pt expressed understanding and agreed  Pt is requesting a c/b regarding additional labs

## 2021-12-24 ENCOUNTER — Encounter: Payer: Self-pay | Admitting: Internal Medicine

## 2021-12-25 ENCOUNTER — Ambulatory Visit (INDEPENDENT_AMBULATORY_CARE_PROVIDER_SITE_OTHER): Payer: 59

## 2021-12-25 ENCOUNTER — Other Ambulatory Visit: Payer: Self-pay

## 2021-12-25 DIAGNOSIS — D510 Vitamin B12 deficiency anemia due to intrinsic factor deficiency: Secondary | ICD-10-CM | POA: Diagnosis not present

## 2021-12-25 MED ORDER — CYANOCOBALAMIN 1000 MCG/ML IJ SOLN
1000.0000 ug | Freq: Once | INTRAMUSCULAR | Status: AC
  Administered 2021-12-25: 1000 ug via INTRAMUSCULAR

## 2021-12-25 NOTE — Telephone Encounter (Signed)
Called pt, LVM to discuss.  

## 2021-12-25 NOTE — Progress Notes (Signed)
Pt here for monthly B12 injection per Dr.Jones  B12 given IM, and pt tolerated injection well.  Next B12 injection scheduled for 01/22/22.

## 2022-01-04 ENCOUNTER — Encounter: Payer: Self-pay | Admitting: Internal Medicine

## 2022-01-19 ENCOUNTER — Encounter: Payer: Self-pay | Admitting: Internal Medicine

## 2022-01-19 ENCOUNTER — Other Ambulatory Visit: Payer: Self-pay | Admitting: Internal Medicine

## 2022-01-19 DIAGNOSIS — E230 Hypopituitarism: Secondary | ICD-10-CM | POA: Insufficient documentation

## 2022-01-22 ENCOUNTER — Other Ambulatory Visit: Payer: Self-pay

## 2022-01-22 ENCOUNTER — Ambulatory Visit (INDEPENDENT_AMBULATORY_CARE_PROVIDER_SITE_OTHER): Payer: 59

## 2022-01-22 DIAGNOSIS — D510 Vitamin B12 deficiency anemia due to intrinsic factor deficiency: Secondary | ICD-10-CM

## 2022-01-22 MED ORDER — CYANOCOBALAMIN 1000 MCG/ML IJ SOLN
1000.0000 ug | Freq: Once | INTRAMUSCULAR | Status: AC
  Administered 2022-01-22: 1000 ug via INTRAMUSCULAR

## 2022-01-22 NOTE — Progress Notes (Signed)
Pt here for monthly B12 injection per Dr. Yetta Barre ? ?B12 given IM, and pt tolerated injection well. ? ?Next B12 injection scheduled for 02/23/22 ?

## 2022-02-13 ENCOUNTER — Other Ambulatory Visit: Payer: Self-pay

## 2022-02-13 DIAGNOSIS — Z122 Encounter for screening for malignant neoplasm of respiratory organs: Secondary | ICD-10-CM

## 2022-02-13 DIAGNOSIS — Z87891 Personal history of nicotine dependence: Secondary | ICD-10-CM

## 2022-02-23 ENCOUNTER — Ambulatory Visit: Payer: 59

## 2022-02-26 NOTE — Progress Notes (Signed)
Virtual Visit via Telephone Note ? ?I connected with Collin Horne on 02/26/22 at 11:30 AM EDT by telephone and verified that I am speaking with the correct person using two identifiers. ? ?Location: ?Patient:  At home ?Provider:  [address], [place], [place], [address]  ?  ?I discussed the limitations, risks, security and privacy concerns of performing an evaluation and management service by telephone and the availability of in person appointments. I also discussed with the patient that there may be a patient responsible charge related to this service. The patient expressed understanding and agreed to proceed. ? ?Shared Decision Making Visit Lung Cancer Screening Program ?([phone]) ? ? ?Eligibility: ?Age 12 y.o. ?Pack Years Smoking History Calculation 30 pack year smoking history ?(# packs/per year x # years smoked) ?Recent History of coughing up blood  no ?Unexplained weight loss? no ?( >Than 15 pounds within the last 6 months ) ?Prior History Lung / other cancer no ?(Diagnosis within the last 5 years already requiring surveillance chest CT Scans). ?Smoking Status Former Smoker ?Former Smokers: Years since quit: 2 years ? Quit Date: 2023 ? ?Visit Components: ?Discussion included one or more decision making aids. yes ?Discussion included risk/benefits of screening. yes ?Discussion included potential follow up diagnostic testing for abnormal scans. yes ?Discussion included meaning and risk of over diagnosis. yes ?Discussion included meaning and risk of False Positives. yes ?Discussion included meaning of total radiation exposure. yes ? ?Counseling Included: ?Importance of adherence to annual lung cancer LDCT screening. yes ?Impact of comorbidities on ability to participate in the program. yes ?Ability and willingness to under diagnostic treatment. yes ? ?Smoking Cessation Counseling: ?Current Smokers:  ?Discussed importance of smoking cessation. yes ?Information about tobacco cessation classes and  interventions provided to patient. yes ?Patient provided with "ticket" for LDCT Scan. yes ?Symptomatic Patient. no ? Counseling NA ?Diagnosis Code: Tobacco Use Z72.0 ?Asymptomatic Patient yes ? Counseling (Intermediate counseling: > three minutes counseling) O0355 ?Former Smokers:  ?Discussed the importance of maintaining cigarette abstinence. yes ?Diagnosis Code: Personal History of Nicotine Dependence. H74.163 ?Information about tobacco cessation classes and interventions provided to patient. Yes ?Patient provided with "ticket" for LDCT Scan. yes ?Written Order for Lung Cancer Screening with LDCT placed in Epic. Yes ?(CT Chest Lung Cancer Screening Low Dose W/O CM) [id number] ?Z12.2-Screening of respiratory organs ?Z87.891-Personal history of nicotine dependence ? ?I spent 25 minutes of face to face time/virtual visit time  with  Collin Horne discussing the risks and benefits of lung cancer screening. We took the time to pause the power point at intervals to allow for questions to be asked and answered to ensure understanding. We discussed that he had taken the single most powerful action possible to decrease his risk of developing lung cancer when he quit smoking. I counseled him to remain smoke free, and to contact me if he ever had the desire to smoke again so that I can provide resources and tools to help support the effort to remain smoke free. We discussed the time and location of the scan, and that either  Abigail Miyamoto RN, Karlton Lemon, RN or I  or I will call / send a letter with the results within  24-72 hours of receiving them. He has the office contact information in the event he needs to speak with me,  he verbalized understanding of all of the above and had no further questions upon leaving the office.  ? ? ? ?I explained to the patient that there has been a  high incidence of coronary artery disease noted on these exams. I explained that this is a non-gated exam therefore degree or severity cannot be  determined. This patient is not on statin therapy. I have asked the patient to follow-up with their PCP regarding any incidental finding of coronary artery disease and management with diet or medication as they feel is clinically indicated. The patient verbalized understanding of the above and had no further questions. ?  ? ?  ? ? ?Collin Ngo, NP ?02/27/2022 ? ? ? ? ? ? ?

## 2022-02-27 ENCOUNTER — Encounter: Payer: Self-pay | Admitting: Acute Care

## 2022-02-27 ENCOUNTER — Telehealth: Payer: Self-pay | Admitting: Acute Care

## 2022-02-27 ENCOUNTER — Ambulatory Visit (INDEPENDENT_AMBULATORY_CARE_PROVIDER_SITE_OTHER): Payer: 59 | Admitting: Acute Care

## 2022-02-27 DIAGNOSIS — Z87891 Personal history of nicotine dependence: Secondary | ICD-10-CM

## 2022-02-27 NOTE — Telephone Encounter (Signed)
[name] Insurance denied the location of his LCS CT which was [facility]. Denise rescheduled him at [place] [facility] on 03/02/2022 @ 10:30am  I have spoke with [name] and he is aware ?

## 2022-02-27 NOTE — Telephone Encounter (Signed)
CT low dose is scheduled for 03/02/2022. ? ?Synetta Fail, did you call patient? ?

## 2022-02-27 NOTE — Patient Instructions (Signed)
Thank you for participating in the [facility]. It was our pleasure to meet you today. We will call you with the results of your scan within the next few days. Your scan will be assigned a Lung RADS category score by the physicians reading the scans.  This Lung RADS score determines follow up scanning.  See below for description of categories, and follow up screening recommendations. We will be in touch to schedule your follow up screening annually or based on recommendations of our providers. We will fax a copy of your scan results to your Primary Care Physician, or the physician who referred you to the program, to ensure they have the results. Please call the office if you have any questions or concerns regarding your scanning experience or results.  Our office number is [phone]. Please speak with Denise Phelps, RN. , or  Denise Buckner RN, They are  our Lung Cancer Screening RN.'s If They are unavailable when you call, Please leave a message on the voice mail. We will return your call at our earliest convenience.This voice mail is monitored several times a day.  Remember, if your scan is normal, we will scan you annually as long as you continue to meet the criteria for the program. (Age 55-77, Current smoker or smoker who has quit within the last 15 years). If you are a smoker, remember, quitting is the single most powerful action that you can take to decrease your risk of lung cancer and other pulmonary, breathing related problems. We know quitting is hard, and we are here to help.  Please let us know if there is anything we can do to help you meet your goal of quitting. If you are a former smoker, congratulations. We are proud of you! Remain smoke free! Remember you can refer friends or family members through the number above.  We will screen them to make sure they meet criteria for the program. Thank you for helping us take better care of you by  participating in Lung Screening.  You can receive free nicotine replacement therapy ( patches, gum or mints) by calling 1-800-QUIT NOW. Please call so we can get you on the path to becoming  a non-smoker. I know it is hard, but you can do this!  Lung RADS Categories:  Lung RADS 1: no nodules or definitely non-concerning nodules.  Recommendation is for a repeat annual scan in 12 months.  Lung RADS 2:  nodules that are non-concerning in appearance and behavior with a very low likelihood of becoming an active cancer. Recommendation is for a repeat annual scan in 12 months.  Lung RADS 3: nodules that are probably non-concerning , includes nodules with a low likelihood of becoming an active cancer.  Recommendation is for a 6-month repeat screening scan. Often noted after an upper respiratory illness. We will be in touch to make sure you have no questions, and to schedule your 6-month scan.  Lung RADS 4 A: nodules with concerning findings, recommendation is most often for a follow up scan in 3 months or additional testing based on our provider's assessment of the scan. We will be in touch to make sure you have no questions and to schedule the recommended 3 month follow up scan.  Lung RADS 4 B:  indicates findings that are concerning. We will be in touch with you to schedule additional diagnostic testing based on our provider's  assessment of the scan.  Other options for assistance in smoking cessation (   As covered by your insurance benefits)  Hypnosis for smoking cessation  [employer] [phone]  Acupuncture for smoking cessation  [facility] [phone]   

## 2022-02-28 ENCOUNTER — Other Ambulatory Visit: Payer: 59

## 2022-03-02 ENCOUNTER — Ambulatory Visit (INDEPENDENT_AMBULATORY_CARE_PROVIDER_SITE_OTHER): Payer: 59

## 2022-03-02 ENCOUNTER — Ambulatory Visit (INDEPENDENT_AMBULATORY_CARE_PROVIDER_SITE_OTHER)
Admission: RE | Admit: 2022-03-02 | Discharge: 2022-03-02 | Disposition: A | Discharge status: Home / Self Care | Payer: 59 | Source: Ambulatory Visit | Attending: Acute Care | Admitting: Acute Care

## 2022-03-02 DIAGNOSIS — Z87891 Personal history of nicotine dependence: Secondary | ICD-10-CM | POA: Diagnosis not present

## 2022-03-02 DIAGNOSIS — Z122 Encounter for screening for malignant neoplasm of respiratory organs: Secondary | ICD-10-CM

## 2022-03-02 DIAGNOSIS — D510 Vitamin B12 deficiency anemia due to intrinsic factor deficiency: Secondary | ICD-10-CM

## 2022-03-02 MED ORDER — CYANOCOBALAMIN 1000 MCG/ML IJ SOLN
1000.0000 ug | Freq: Once | INTRAMUSCULAR | Status: AC
  Administered 2022-03-02: 1000 ug via INTRAMUSCULAR

## 2022-03-02 NOTE — Progress Notes (Signed)
B12 given Please cosign 

## 2022-03-05 ENCOUNTER — Other Ambulatory Visit: Payer: Self-pay

## 2022-03-05 DIAGNOSIS — Z87891 Personal history of nicotine dependence: Secondary | ICD-10-CM

## 2022-03-05 DIAGNOSIS — Z122 Encounter for screening for malignant neoplasm of respiratory organs: Secondary | ICD-10-CM

## 2022-04-12 ENCOUNTER — Encounter: Payer: Self-pay | Admitting: Internal Medicine

## 2022-04-12 ENCOUNTER — Other Ambulatory Visit: Payer: Self-pay | Admitting: Internal Medicine

## 2022-04-12 DIAGNOSIS — N5201 Erectile dysfunction due to arterial insufficiency: Secondary | ICD-10-CM

## 2022-04-12 MED ORDER — SILDENAFIL CITRATE 100 MG PO TABS: 100.0000 mg | ORAL_TABLET | Freq: Every day | ORAL | 3 refills | Status: DC | PRN

## 2022-04-23 ENCOUNTER — Encounter: Payer: Self-pay | Admitting: Internal Medicine

## 2022-05-21 ENCOUNTER — Encounter: Payer: Self-pay | Admitting: Internal Medicine

## 2022-05-21 ENCOUNTER — Ambulatory Visit (INDEPENDENT_AMBULATORY_CARE_PROVIDER_SITE_OTHER): Payer: 59 | Admitting: Internal Medicine

## 2022-05-21 VITALS — BP 122/80 | HR 80 | Temp 98.9°F | Ht 66.0 in | Wt 154.2 lb

## 2022-05-21 DIAGNOSIS — L219 Seborrheic dermatitis, unspecified: Secondary | ICD-10-CM | POA: Diagnosis not present

## 2022-05-21 MED ORDER — ITRACONAZOLE 100 MG PO CAPS: 200.0000 mg | ORAL_CAPSULE | Freq: Every day | ORAL | 0 refills | Status: AC

## 2022-05-21 MED ORDER — PIMECROLIMUS 1 % EX CREA: TOPICAL_CREAM | Freq: Two times a day (BID) | CUTANEOUS | 1 refills | Status: DC

## 2022-05-21 NOTE — Progress Notes (Signed)
Subjective:  Patient ID: Collin Horne, male    DOB: 12-11-1965  Age: 56 y.o. MRN: [id number]  CC: Rash   HPI Collin Horne presents for f/up -  He complains of a recurring rash on his face.  He has areas around and in between his eyebrows that are red, itchy, and flaky.  He also has some excoriations on his face and extremities.  He has tried multiple over-the-counter treatment options with no success.  Outpatient Medications Prior to Visit  Medication Sig Dispense Refill   desvenlafaxine (PRISTIQ) 100 MG 24 hr tablet Take 100 mg by mouth daily.     gabapentin (NEURONTIN) 600 MG tablet 3 (three) times daily before meals.     sildenafil (VIAGRA) 100 MG tablet Take 1 tablet (100 mg total) by mouth daily as needed for erectile dysfunction. 5 tablet 3   No facility-administered medications prior to visit.    ROS Review of Systems  Constitutional: Negative.  Negative for fatigue.  HENT: Negative.    Eyes: Negative.   Respiratory:  Negative for cough, chest tightness, shortness of breath and wheezing.   Cardiovascular:  Negative for chest pain.  Gastrointestinal:  Negative for abdominal pain.  Endocrine: Negative.   Genitourinary: Negative.  Negative for difficulty urinating.  Musculoskeletal: Negative.   Skin:  Positive for rash.  Neurological:  Negative for dizziness, weakness and headaches.  Hematological:  Negative for adenopathy. Does not bruise/bleed easily.  Psychiatric/Behavioral: Negative.      Objective:  BP 122/80 (BP Location: Left Arm, Patient Position: Sitting)   Pulse 80   Temp 98.9 F (37.2 C) (Oral)   Ht 5\' 6"  (1.676 m)   Wt 154 lb 4 oz (70 kg)   SpO2 95%   BMI 24.90 kg/m   BP Readings from Last 3 Encounters:  05/21/22 122/80  12/21/21 122/82  09/02/18 106/70    Wt Readings from Last 3 Encounters:  05/21/22 154 lb 4 oz (70 kg)  12/21/21 158 lb (71.7 kg)  09/02/18 168 lb 4 oz (76.3 kg)    Physical Exam Vitals reviewed.   HENT:     Nose: Nose normal.     Mouth/Throat:     Mouth: Mucous membranes are moist.  Eyes:     General: No scleral icterus.    Conjunctiva/sclera: Conjunctivae normal.  Cardiovascular:     Rate and Rhythm: Normal rate and regular rhythm.     Heart sounds: No murmur heard. Pulmonary:     Effort: Pulmonary effort is normal.     Breath sounds: No stridor. No wheezing, rhonchi or rales.  Abdominal:     General: Abdomen is flat.     Palpations: There is no mass.     Tenderness: There is no abdominal tenderness. There is no guarding.     Hernia: No hernia is present.  Musculoskeletal:        General: Normal range of motion.     Cervical back: Neck supple.     Right lower leg: No edema.     Left lower leg: No edema.  Lymphadenopathy:     Cervical: No cervical adenopathy.  Skin:    General: Skin is warm.     Coloration: Skin is not pale.     Findings: Rash present. Rash is macular and scaling. Rash is not crusting, nodular, papular, purpuric, pustular, urticarial or vesicular.     Comments: In between the eyebrows and across the eyebrows there are  patches of erythema with scale.  There are excoriations on the lower face and extremities that look like it might be picker's disease.  Neurological:     General: No focal deficit present.     Mental Status: He is alert.  Psychiatric:        Mood and Affect: Mood normal.        Behavior: Behavior normal.     Lab Results  Component Value Date   WBC 8.3 12/21/2021   HGB 13.4 12/21/2021   HCT 40.4 12/21/2021   PLT 341.0 12/21/2021   GLUCOSE 103 (H) 12/21/2021   CHOL 151 12/21/2021   TRIG 202.0 (H) 12/21/2021   HDL 55.90 12/21/2021   LDLDIRECT 70.0 12/21/2021   LDLCALC 112 (H) 07/16/2017   ALT 11 12/21/2021   AST 14 12/21/2021   NA 139 12/21/2021   K 4.2 12/21/2021   CL 102 12/21/2021   CREATININE 1.12 12/21/2021   BUN 4 (L) 12/21/2021   CO2 29 12/21/2021   TSH 0.55 12/21/2021   PSA 1.32 12/21/2021    CT CHEST LUNG CA  SCREEN LOW DOSE W/O CM  Result Date: 03/03/2022 CLINICAL DATA:  56 year old male with 32 pack-year history of smoking. Lung cancer screening. EXAM: CT CHEST WITHOUT CONTRAST LOW-DOSE FOR LUNG CANCER SCREENING TECHNIQUE: Multidetector CT imaging of the chest was performed following the standard protocol without IV contrast. RADIATION DOSE REDUCTION: This exam was performed according to the departmental dose-optimization program which includes automated exposure control, adjustment of the mA and/or kV according to patient size and/or use of iterative reconstruction technique. COMPARISON:  None. FINDINGS: Cardiovascular: The heart size is normal. No substantial pericardial effusion. Coronary artery calcification is evident. Mild atherosclerotic calcification is noted in the wall of the thoracic aorta. Mediastinum/Nodes: No mediastinal lymphadenopathy. No evidence for gross hilar lymphadenopathy although assessment is limited by the lack of intravenous contrast on the current study. The esophagus has normal imaging features. There is no axillary lymphadenopathy. Lungs/Pleura: Subtle changes of centrilobular emphysema noted in the lung apices. Calcified granuloma noted left upper lobe. No suspicious pulmonary nodule or mass. No focal airspace consolidation. No pleural effusion. Upper Abdomen: Unremarkable. Musculoskeletal: No worrisome lytic or sclerotic osseous abnormality. IMPRESSION: 1. Lung-RADS 1, negative. Continue annual screening with low-dose chest CT without contrast in 12 months. 2. Aortic Atherosclerosis (ICD10-I70.0) and Emphysema (ICD10-J43.9). Electronically Signed   By: Kennith Center M.D.   On: 03/03/2022 12:02   Assessment & Plan:   Collin Horne was seen today for rash.  Diagnoses and all orders for this visit:  Acute seborrheic dermatitis- Will treat with systemic itraconazole and topical pimecrolimus. -     itraconazole (SPORANOX) 100 MG capsule; Take 2 capsules (200 mg total) by mouth daily for  7 days. -     pimecrolimus (ELIDEL) 1 % cream; Apply topically 2 (two) times daily.   I am having Collin Salvo Crisci "Ray" start on itraconazole and pimecrolimus. I am also having him maintain his gabapentin, sildenafil, and desvenlafaxine.  Meds ordered this encounter  Medications   itraconazole (SPORANOX) 100 MG capsule    Sig: Take 2 capsules (200 mg total) by mouth daily for 7 days.    Dispense:  14 capsule    Refill:  0   pimecrolimus (ELIDEL) 1 % cream    Sig: Apply topically 2 (two) times daily.    Dispense:  60 g    Refill:  1     Follow-up: Return in about 3 weeks (around 06/11/2022).  Maisie Fus  Ronnald Ramp, MD

## 2022-05-21 NOTE — Patient Instructions (Signed)
Seborrheic Dermatitis, Adult Seborrheic dermatitis is a skin disease that causes red, scaly patches. It usually occurs on the scalp, and it is often called dandruff. The patches may appear on other parts of the body. Skin patches tend to appear where there are many oil glands in the skin. Areas of the body that are commonly affected include the: Scalp. Ears. Eyebrows. Face. Bearded area of men's faces. Skin folds of the body, such as the armpits, groin, and buttocks. Chest. The condition may come and go for no known reason, and it is often long-lasting (chronic). What are the causes? The cause of this condition is not known. What increases the risk? The following factors may make you more likely to develop this condition: Having certain conditions, such as: HIV (human immunodeficiency virus). AIDS (acquired immunodeficiency syndrome). Parkinson's disease. Mood disorders, such as depression. Being 40-60 years old. What are the signs or symptoms? Symptoms of this condition include: Thick scales on the scalp. Redness on the face or in the armpits. Skin that is flaky. The flakes may be white or yellow. Skin that seems oily or dry but is not helped with moisturizers. Itching or burning in the affected areas. How is this diagnosed? This condition is diagnosed with a medical history and physical exam. A sample of your skin may be tested (skin biopsy). You may need to see a skin specialist (dermatologist). How is this treated? There is no cure for this condition, but treatment can help to manage the symptoms. You may get treatment to remove scales, lower the risk of skin infection, and reduce swelling or itching. Treatment may include: Creams that reduce skin yeast. Medicated shampoo. Moisturizing creams or ointments. Creams that reduce swelling and irritation (steroids). Follow these instructions at home: Apply over-the-counter and prescription medicines only as told by your health care  provider. Use any medicated shampoo, skin creams, or ointments only as told by your health care provider. Keep all follow-up visits as told by your health care provider. This is important. Contact a health care provider if: Your symptoms do not improve with treatment. Your symptoms get worse. You have new symptoms. Get help right away if: Your condition rapidly worsens with treatment. Summary Seborrheic dermatitis is a skin disease that causes red, scaly patches. Seborrheic dermatitis commonly affects the scalp, face, and skin folds. There is no cure for this condition, but treatment can help to manage the symptoms. This information is not intended to replace advice given to you by your health care provider. Make sure you discuss any questions you have with your health care provider. Document Revised: 08/06/2019 Document Reviewed: 08/06/2019 Elsevier Patient Education  2023 Elsevier Inc.  

## 2022-05-24 ENCOUNTER — Telehealth: Payer: Self-pay | Admitting: Internal Medicine

## 2022-05-24 NOTE — Telephone Encounter (Signed)
Pt is requesting a prior authorization for pimecrolimus (ELIDEL) 1 % cream. He stated the pharmacy told him they will not release the rx to him without a PA.   Please advise  Pharmacy [facility] [phone] - [place], [place] - [address] AT [facility] [address] Phone:  [phone]  Fax:  [phone]

## 2022-05-24 NOTE — Telephone Encounter (Signed)
Submitted PA via cover-my-meds w/  (Key: [id number])r rec'd msg "Your information has been sent to [facility]."

## 2022-05-24 NOTE — Telephone Encounter (Signed)
Rec'd msg stating PA Case: [id number], Status: Closed, Closed Reason Code: FM Product not covered by this plan. Will have to reach out to Friday Plan for alternative.Jeanene Erb plan spoke w/ rep.Janey Greaser is excluded from plan. MD can choose from Valerape cream, Alcolonesone Dip cream, Augmentin cream, ointment, or lotion.Marland KitchenRaechel Chute

## 2022-05-25 ENCOUNTER — Encounter: Payer: Self-pay | Admitting: Internal Medicine

## 2022-05-25 ENCOUNTER — Other Ambulatory Visit: Payer: Self-pay | Admitting: Internal Medicine

## 2022-05-25 DIAGNOSIS — L219 Seborrheic dermatitis, unspecified: Secondary | ICD-10-CM

## 2022-05-25 MED ORDER — TRIAMCINOLONE ACETONIDE 0.5 % EX CREA: 1.0000 | TOPICAL_CREAM | Freq: Three times a day (TID) | CUTANEOUS | 1 refills | Status: DC

## 2022-06-11 ENCOUNTER — Telehealth: Payer: Self-pay | Admitting: Internal Medicine

## 2022-06-11 ENCOUNTER — Other Ambulatory Visit: Payer: Self-pay | Admitting: Internal Medicine

## 2022-06-11 DIAGNOSIS — L219 Seborrheic dermatitis, unspecified: Secondary | ICD-10-CM

## 2022-06-11 MED ORDER — KETOCONAZOLE 2 % EX CREA: 1.0000 | TOPICAL_CREAM | Freq: Two times a day (BID) | CUTANEOUS | 2 refills | Status: DC

## 2022-06-11 NOTE — Telephone Encounter (Signed)
Pt was seen on 05/21/22 for Seborrheic dermatitis. Pt was prescribed Pimecrolimus. Pt stated his insurance did not cover rx so he did not pick it up. He said his condition is not getting better so he would like rx sent to pharmacy. He stated if it is not too costly then he will just pay out of pocket.   Pharmacy:  [facility] [phone] - [place], [place] - [address] Phone:  [phone]  Fax:  [phone]

## 2022-06-12 ENCOUNTER — Ambulatory Visit (INDEPENDENT_AMBULATORY_CARE_PROVIDER_SITE_OTHER): Payer: 59 | Admitting: Emergency Medicine

## 2022-06-12 ENCOUNTER — Encounter: Payer: Self-pay | Admitting: Emergency Medicine

## 2022-06-12 VITALS — BP 120/74 | HR 69 | Temp 98.1°F | Ht 66.0 in | Wt 153.4 lb

## 2022-06-12 DIAGNOSIS — L0889 Other specified local infections of the skin and subcutaneous tissue: Secondary | ICD-10-CM | POA: Insufficient documentation

## 2022-06-12 DIAGNOSIS — S2341XA Sprain of ribs, initial encounter: Secondary | ICD-10-CM | POA: Insufficient documentation

## 2022-06-12 DIAGNOSIS — L309 Dermatitis, unspecified: Secondary | ICD-10-CM

## 2022-06-12 MED ORDER — DICLOFENAC SODIUM 75 MG PO TBEC: 75.0000 mg | DELAYED_RELEASE_TABLET | Freq: Two times a day (BID) | ORAL | 0 refills | Status: DC | PRN

## 2022-06-12 MED ORDER — DOXYCYCLINE HYCLATE 100 MG PO TABS: 100.0000 mg | ORAL_TABLET | Freq: Two times a day (BID) | ORAL | 0 refills | Status: AC

## 2022-06-12 NOTE — Assessment & Plan Note (Signed)
Requesting pain medication.  Has history of substance abuse. Advised to try Voltaren 75 mg twice a day as needed

## 2022-06-12 NOTE — Progress Notes (Signed)
Collin Horne 56 y.o.   Chief Complaint  Patient presents with   skin issues    Face and scalp     HISTORY OF PRESENT ILLNESS: This is a 56 y.o. male complaining of itchy rash in the face and scalp. Recently diagnosed with seborrheic dermatitis. Used triamcinolone but no results. Recently prescribed ketoconazole lotion. Also complaining of bilateral pain to rib cage for several weeks worse on inspiration and movement.  HPI   Prior to Admission medications   Medication Sig Start Date End Date Taking? Authorizing Provider  desvenlafaxine (PRISTIQ) 100 MG 24 hr tablet Take 100 mg by mouth daily.   Yes [provider]  diclofenac (VOLTAREN) 75 MG EC tablet Take 1 tablet (75 mg total) by mouth 2 (two) times daily as needed. 06/12/22  Yes Sowmya Partridge, Eilleen Kempf, MD  doxycycline (VIBRA-TABS) 100 MG tablet Take 1 tablet (100 mg total) by mouth 2 (two) times daily for 10 days. 06/12/22 06/22/22 Yes Racine Erby, Eilleen Kempf, MD  gabapentin (NEURONTIN) 600 MG tablet Take 800 mg by mouth 3 (three) times daily before meals.   Yes [provider]  ketoconazole (NIZORAL) 2 % cream Apply 1 Application topically 2 (two) times daily. 06/11/22  Yes Etta Grandchild, MD  lamoTRIgine (LAMICTAL) 150 MG tablet Take 150 mg by mouth daily.   Yes [provider]  sildenafil (VIAGRA) 100 MG tablet Take 1 tablet (100 mg total) by mouth daily as needed for erectile dysfunction. 04/12/22  Yes Etta Grandchild, MD  triamcinolone cream (KENALOG) 0.5 % Apply 1 Application topically 3 (three) times daily. 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/2022   Need for hepatitis C screening test 12/21/2021   Encounter for general adult medical examination with abnormal findings 12/21/2021   Chronic fatigue 12/21/2021   Screen for colon  cancer 12/21/2021   Vitamin B12 deficiency anemia due to intrinsic factor deficiency 09/03/2018   Alcohol dependence with unspecified alcohol-induced disorder (HCC) 09/02/2018   Pure hyperglyceridemia 07/16/2017   Eczema 07/16/2017   Seborrheic dermatitis 06/17/2017   DDD (degenerative disc disease), cervical 06/17/2017   Tobacco abuse 04/10/2016   Alcohol abuse, in remission 04/10/2016   Anxiety and depression 04/10/2016   Drug-induced erectile dysfunction 04/10/2016    Past Medical History:  Diagnosis Date   Depression    Hyperlipidemia    Substance abuse (HCC)     Past Surgical History:  Procedure Laterality Date   ANKLE SURGERY  2007    Social History   Socioeconomic History   Marital status: Single    Spouse name: Not on file   Number of children: Not on file   Years of education: Not on file   Highest education level: Not on file  Occupational History   Not on file  Tobacco Use   Smoking status: Every Day    Packs/day: 1.00    Years: 30.00    Total pack years: 30.00    Types: Cigarettes, E-cigarettes    Last attempt to quit: 06/21/2020    Years since quitting: 1.9   Smokeless tobacco: Never  Substance and Sexual Activity   Alcohol use: Not Currently   Drug use: Never   Sexual activity: Not Currently  Other Topics Concern   Not on file  Social History Narrative   Not on file  Social Determinants of Health   Financial Resource Strain: Not on file  Food Insecurity: Not on file  Transportation Needs: Not on file  Physical Activity: Not on file  Stress: Not on file  Social Connections: Not on file  Intimate Partner Violence: Not on file    Family History  Problem Relation Age of Onset   Arthritis Mother    Alcohol abuse Father    Depression Father    Early death Father    Depression Sister    Heart disease Neg Hx    Hyperlipidemia Neg Hx    Hypertension Neg Hx    Kidney disease Neg Hx    Stroke Neg Hx    Asthma Neg Hx      Review of  Systems  Constitutional: Negative.  Negative for chills and fever.  HENT: Negative.  Negative for congestion and sore throat.   Respiratory: Negative.  Negative for cough and shortness of breath.   Cardiovascular:  Negative for chest pain and palpitations.  Gastrointestinal:  Negative for abdominal pain, nausea and vomiting.  Genitourinary: Negative.   Skin:  Positive for rash.  Neurological: Negative.  Negative for dizziness and headaches.  All other systems reviewed and are negative.  Today's Vitals   06/12/22 1057  BP: 120/74  Pulse: 69  Temp: 98.1 F (36.7 C)  TempSrc: Oral  SpO2: 95%  Weight: 153 lb 6 oz (69.6 kg)  Height: 5\' 6"  (1.676 m)   Body mass index is 24.76 kg/m.   Physical Exam Vitals reviewed.  Constitutional:      Appearance: Normal appearance.  HENT:     Head: Normocephalic.  Eyes:     Extraocular Movements: Extraocular movements intact.     Pupils: Pupils are equal, round, and reactive to light.  Cardiovascular:     Rate and Rhythm: Normal rate and regular rhythm.     Pulses: Normal pulses.     Heart sounds: Normal heart sounds.  Pulmonary:     Effort: Pulmonary effort is normal.     Breath sounds: Normal breath sounds.  Musculoskeletal:     Cervical back: No tenderness.  Lymphadenopathy:     Cervical: No cervical adenopathy.  Skin:    General: Skin is warm and dry.     Capillary Refill: Capillary refill takes less than 2 seconds.     Findings: Rash present.     Comments: Several erythematous spots in the face along with scratch marks.  Some appear infected.  Neurological:     General: No focal deficit present.     Mental Status: He is alert and oriented to person, place, and time.  Psychiatric:        Mood and Affect: Mood normal.        Behavior: Behavior normal.      ASSESSMENT & PLAN: A total of 45 minutes was spent with the patient and counseling/coordination of care regarding preparing for this visit, review of most recent office  visit notes and available medical records, review of chronic medical problems and their management, review of all medications, diagnosis of facial dermatitis and secondary bacterial infection and need for antibiotics, diagnosis of rib cage sprain and treatment, prognosis, documentation, and need for follow-up  Problem List Items Addressed This Visit       Musculoskeletal and Integument   Facial dermatitis - Primary    On responsive to corticosteroids. Advised to stop use of corticosteroids in the face Start using ketoconazole lotion as prescribed.  Relevant Medications   doxycycline (VIBRA-TABS) 100 MG tablet   Other Relevant Orders   Ambulatory referral to Dermatology   Secondary infection of skin    Some facial lesions are infected. Start doxycycline 100 mg twice a day for 7 days.      Relevant Medications   doxycycline (VIBRA-TABS) 100 MG tablet   Other Relevant Orders   Ambulatory referral to Dermatology   Sprain of ribs    Requesting pain medication.  Has history of substance abuse. Advised to try Voltaren 75 mg twice a day as needed      Relevant Medications   diclofenac (VOLTAREN) 75 MG EC tablet   Patient Instructions  Health Maintenance After Age 29 After age 68, you are at a higher risk for certain long-term diseases and infections as well as injuries from falls. Falls are a major cause of broken bones and head injuries in people who are older than age 107. Getting regular preventive care can help to keep you healthy and well. Preventive care includes getting regular testing and making lifestyle changes as recommended by your health care provider. Talk with your health care provider about: Which screenings and tests you should have. A screening is a test that checks for a disease when you have no symptoms. A diet and exercise plan that is right for you. What should I know about screenings and tests to prevent falls? Screening and testing are the best ways to find a  health problem early. Early diagnosis and treatment give you the best chance of managing medical conditions that are common after age 37. Certain conditions and lifestyle choices may make you more likely to have a fall. Your health care provider may recommend: Regular vision checks. Poor vision and conditions such as cataracts can make you more likely to have a fall. If you wear glasses, make sure to get your prescription updated if your vision changes. Medicine review. Work with your health care provider to regularly review all of the medicines you are taking, including over-the-counter medicines. Ask your health care provider about any side effects that may make you more likely to have a fall. Tell your health care provider if any medicines that you take make you feel dizzy or sleepy. Strength and balance checks. Your health care provider may recommend certain tests to check your strength and balance while standing, walking, or changing positions. Foot health exam. Foot pain and numbness, as well as not wearing proper footwear, can make you more likely to have a fall. Screenings, including: Osteoporosis screening. Osteoporosis is a condition that causes the bones to get weaker and break more easily. Blood pressure screening. Blood pressure changes and medicines to control blood pressure can make you feel dizzy. Depression screening. You may be more likely to have a fall if you have a fear of falling, feel depressed, or feel unable to do activities that you used to do. Alcohol use screening. Using too much alcohol can affect your balance and may make you more likely to have a fall. Follow these instructions at home: Lifestyle Do not drink alcohol if: Your health care provider tells you not to drink. If you drink alcohol: Limit how much you have to: 0-1 drink a day for women. 0-2 drinks a day for men. Know how much alcohol is in your drink. In the [place] one drink equals one 12 oz bottle of beer  (355 mL), one 5 oz glass of wine (148 mL), or one 1 oz glass of hard  liquor (44 mL). Do not use any products that contain nicotine or tobacco. These products include cigarettes, chewing tobacco, and vaping devices, such as e-cigarettes. If you need help quitting, ask your health care provider. Activity  Follow a regular exercise program to stay fit. This will help you maintain your balance. Ask your health care provider what types of exercise are appropriate for you. If you need a cane or walker, use it as recommended by your health care provider. Wear supportive shoes that have nonskid soles. Safety  Remove any tripping hazards, such as rugs, cords, and clutter. Install safety equipment such as grab bars in bathrooms and safety rails on stairs. Keep rooms and walkways well-lit. General instructions Talk with your health care provider about your risks for falling. Tell your health care provider if: You fall. Be sure to tell your health care provider about all falls, even ones that seem minor. You feel dizzy, tiredness (fatigue), or off-balance. Take over-the-counter and prescription medicines only as told by your health care provider. These include supplements. Eat a healthy diet and maintain a healthy weight. A healthy diet includes low-fat dairy products, low-fat (lean) meats, and fiber from whole grains, beans, and lots of fruits and vegetables. Stay current with your vaccines. Schedule regular health, dental, and eye exams. Summary Having a healthy lifestyle and getting preventive care can help to protect your health and wellness after age 52. Screening and testing are the best way to find a health problem early and help you avoid having a fall. Early diagnosis and treatment give you the best chance for managing medical conditions that are more common for people who are older than age 49. Falls are a major cause of broken bones and head injuries in people who are older than age 73. Take  precautions to prevent a fall at home. Work with your health care provider to learn what changes you can make to improve your health and wellness and to prevent falls. This information is not intended to replace advice given to you by your health care provider. Make sure you discuss any questions you have with your health care provider. Document Revised: 03/20/2021 Document Reviewed: 03/20/2021 Elsevier Patient Education  2023 Elsevier Inc.    Edwina Barth, MD [facility] at [facility]

## 2022-06-12 NOTE — Patient Instructions (Signed)
Health Maintenance After Age 56 After age 56, you are at a higher risk for certain long-term diseases and infections as well as injuries from falls. Falls are a major cause of broken bones and head injuries in people who are older than age 56. Getting regular preventive care can help to keep you healthy and well. Preventive care includes getting regular testing and making lifestyle changes as recommended by your health care provider. Talk with your health care provider about: Which screenings and tests you should have. A screening is a test that checks for a disease when you have no symptoms. A diet and exercise plan that is right for you. What should I know about screenings and tests to prevent falls? Screening and testing are the best ways to find a health problem early. Early diagnosis and treatment give you the best chance of managing medical conditions that are common after age 56. Certain conditions and lifestyle choices may make you more likely to have a fall. Your health care provider may recommend: Regular vision checks. Poor vision and conditions such as cataracts can make you more likely to have a fall. If you wear glasses, make sure to get your prescription updated if your vision changes. Medicine review. Work with your health care provider to regularly review all of the medicines you are taking, including over-the-counter medicines. Ask your health care provider about any side effects that may make you more likely to have a fall. Tell your health care provider if any medicines that you take make you feel dizzy or sleepy. Strength and balance checks. Your health care provider may recommend certain tests to check your strength and balance while standing, walking, or changing positions. Foot health exam. Foot pain and numbness, as well as not wearing proper footwear, can make you more likely to have a fall. Screenings, including: Osteoporosis screening. Osteoporosis is a condition that causes  the bones to get weaker and break more easily. Blood pressure screening. Blood pressure changes and medicines to control blood pressure can make you feel dizzy. Depression screening. You may be more likely to have a fall if you have a fear of falling, feel depressed, or feel unable to do activities that you used to do. Alcohol use screening. Using too much alcohol can affect your balance and may make you more likely to have a fall. Follow these instructions at home: Lifestyle Do not drink alcohol if: Your health care provider tells you not to drink. If you drink alcohol: Limit how much you have to: 0-1 drink a day for women. 0-2 drinks a day for men. Know how much alcohol is in your drink. In the [place] one drink equals one 12 oz bottle of beer (355 mL), one 5 oz glass of wine (148 mL), or one 1 oz glass of hard liquor (44 mL). Do not use any products that contain nicotine or tobacco. These products include cigarettes, chewing tobacco, and vaping devices, such as e-cigarettes. If you need help quitting, ask your health care provider. Activity  Follow a regular exercise program to stay fit. This will help you maintain your balance. Ask your health care provider what types of exercise are appropriate for you. If you need a cane or walker, use it as recommended by your health care provider. Wear supportive shoes that have nonskid soles. Safety  Remove any tripping hazards, such as rugs, cords, and clutter. Install safety equipment such as grab bars in bathrooms and safety rails on stairs. Keep rooms and walkways   well-lit. General instructions Talk with your health care provider about your risks for falling. Tell your health care provider if: You fall. Be sure to tell your health care provider about all falls, even ones that seem minor. You feel dizzy, tiredness (fatigue), or off-balance. Take over-the-counter and prescription medicines only as told by your health care provider. These include  supplements. Eat a healthy diet and maintain a healthy weight. A healthy diet includes low-fat dairy products, low-fat (lean) meats, and fiber from whole grains, beans, and lots of fruits and vegetables. Stay current with your vaccines. Schedule regular health, dental, and eye exams. Summary Having a healthy lifestyle and getting preventive care can help to protect your health and wellness after age 56. Screening and testing are the best way to find a health problem early and help you avoid having a fall. Early diagnosis and treatment give you the best chance for managing medical conditions that are more common for people who are older than age 56. Falls are a major cause of broken bones and head injuries in people who are older than age 56. Take precautions to prevent a fall at home. Work with your health care provider to learn what changes you can make to improve your health and wellness and to prevent falls. This information is not intended to replace advice given to you by your health care provider. Make sure you discuss any questions you have with your health care provider. Document Revised: 03/20/2021 Document Reviewed: 03/20/2021 Elsevier Patient Education  2023 Elsevier Inc.  

## 2022-06-12 NOTE — Assessment & Plan Note (Signed)
On responsive to corticosteroids. Advised to stop use of corticosteroids in the face Start using ketoconazole lotion as prescribed.

## 2022-06-12 NOTE — Assessment & Plan Note (Signed)
Some facial lesions are infected. Start doxycycline 100 mg twice a day for 7 days.

## 2022-07-19 DIAGNOSIS — F1420 Cocaine dependence, uncomplicated: Secondary | ICD-10-CM | POA: Diagnosis not present

## 2022-07-19 DIAGNOSIS — R69 Illness, unspecified: Secondary | ICD-10-CM | POA: Diagnosis not present

## 2022-07-19 DIAGNOSIS — F1220 Cannabis dependence, uncomplicated: Secondary | ICD-10-CM | POA: Diagnosis not present

## 2022-07-23 DIAGNOSIS — F1420 Cocaine dependence, uncomplicated: Secondary | ICD-10-CM | POA: Diagnosis not present

## 2022-07-23 DIAGNOSIS — R69 Illness, unspecified: Secondary | ICD-10-CM | POA: Diagnosis not present

## 2022-07-23 DIAGNOSIS — F1220 Cannabis dependence, uncomplicated: Secondary | ICD-10-CM | POA: Diagnosis not present

## 2022-07-26 DIAGNOSIS — F1220 Cannabis dependence, uncomplicated: Secondary | ICD-10-CM | POA: Diagnosis not present

## 2022-07-26 DIAGNOSIS — R69 Illness, unspecified: Secondary | ICD-10-CM | POA: Diagnosis not present

## 2022-07-26 DIAGNOSIS — F1420 Cocaine dependence, uncomplicated: Secondary | ICD-10-CM | POA: Diagnosis not present

## 2022-07-31 ENCOUNTER — Other Ambulatory Visit: Payer: Self-pay | Admitting: Internal Medicine

## 2022-07-31 ENCOUNTER — Encounter: Payer: Self-pay | Admitting: Internal Medicine

## 2022-07-31 DIAGNOSIS — R69 Illness, unspecified: Secondary | ICD-10-CM | POA: Diagnosis not present

## 2022-07-31 DIAGNOSIS — F1220 Cannabis dependence, uncomplicated: Secondary | ICD-10-CM | POA: Diagnosis not present

## 2022-07-31 DIAGNOSIS — N5201 Erectile dysfunction due to arterial insufficiency: Secondary | ICD-10-CM

## 2022-07-31 DIAGNOSIS — F1410 Cocaine abuse, uncomplicated: Secondary | ICD-10-CM | POA: Diagnosis not present

## 2022-07-31 MED ORDER — SILDENAFIL CITRATE 100 MG PO TABS: 100.0000 mg | ORAL_TABLET | Freq: Every day | ORAL | 0 refills | Status: DC | PRN

## 2022-08-02 DIAGNOSIS — F1420 Cocaine dependence, uncomplicated: Secondary | ICD-10-CM | POA: Diagnosis not present

## 2022-08-02 DIAGNOSIS — F1220 Cannabis dependence, uncomplicated: Secondary | ICD-10-CM | POA: Diagnosis not present

## 2022-08-02 DIAGNOSIS — R69 Illness, unspecified: Secondary | ICD-10-CM | POA: Diagnosis not present

## 2022-08-07 ENCOUNTER — Other Ambulatory Visit: Payer: Self-pay | Admitting: Internal Medicine

## 2022-08-07 DIAGNOSIS — N5201 Erectile dysfunction due to arterial insufficiency: Secondary | ICD-10-CM

## 2022-08-07 MED ORDER — SILDENAFIL CITRATE 100 MG PO TABS: 100.0000 mg | ORAL_TABLET | Freq: Every day | ORAL | 0 refills | Status: DC | PRN

## 2022-08-09 DIAGNOSIS — R69 Illness, unspecified: Secondary | ICD-10-CM | POA: Diagnosis not present

## 2022-08-09 DIAGNOSIS — F1220 Cannabis dependence, uncomplicated: Secondary | ICD-10-CM | POA: Diagnosis not present

## 2022-08-09 DIAGNOSIS — F1420 Cocaine dependence, uncomplicated: Secondary | ICD-10-CM | POA: Diagnosis not present

## 2022-08-13 DIAGNOSIS — R293 Abnormal posture: Secondary | ICD-10-CM | POA: Diagnosis not present

## 2022-08-13 DIAGNOSIS — S161XXA Strain of muscle, fascia and tendon at neck level, initial encounter: Secondary | ICD-10-CM | POA: Diagnosis not present

## 2022-08-13 DIAGNOSIS — M542 Cervicalgia: Secondary | ICD-10-CM | POA: Diagnosis not present

## 2022-08-14 DIAGNOSIS — R69 Illness, unspecified: Secondary | ICD-10-CM | POA: Diagnosis not present

## 2022-08-14 DIAGNOSIS — F1410 Cocaine abuse, uncomplicated: Secondary | ICD-10-CM | POA: Diagnosis not present

## 2022-08-14 DIAGNOSIS — F1220 Cannabis dependence, uncomplicated: Secondary | ICD-10-CM | POA: Diagnosis not present

## 2022-08-16 DIAGNOSIS — F1420 Cocaine dependence, uncomplicated: Secondary | ICD-10-CM | POA: Diagnosis not present

## 2022-08-16 DIAGNOSIS — R69 Illness, unspecified: Secondary | ICD-10-CM | POA: Diagnosis not present

## 2022-08-16 DIAGNOSIS — F1220 Cannabis dependence, uncomplicated: Secondary | ICD-10-CM | POA: Diagnosis not present

## 2022-08-20 DIAGNOSIS — S161XXA Strain of muscle, fascia and tendon at neck level, initial encounter: Secondary | ICD-10-CM | POA: Diagnosis not present

## 2022-08-20 DIAGNOSIS — M542 Cervicalgia: Secondary | ICD-10-CM | POA: Diagnosis not present

## 2022-08-20 DIAGNOSIS — R293 Abnormal posture: Secondary | ICD-10-CM | POA: Diagnosis not present

## 2022-08-21 DIAGNOSIS — F1220 Cannabis dependence, uncomplicated: Secondary | ICD-10-CM | POA: Diagnosis not present

## 2022-08-21 DIAGNOSIS — R69 Illness, unspecified: Secondary | ICD-10-CM | POA: Diagnosis not present

## 2022-08-21 DIAGNOSIS — F1410 Cocaine abuse, uncomplicated: Secondary | ICD-10-CM | POA: Diagnosis not present

## 2022-08-22 DIAGNOSIS — R293 Abnormal posture: Secondary | ICD-10-CM | POA: Diagnosis not present

## 2022-08-22 DIAGNOSIS — S161XXA Strain of muscle, fascia and tendon at neck level, initial encounter: Secondary | ICD-10-CM | POA: Diagnosis not present

## 2022-08-22 DIAGNOSIS — M542 Cervicalgia: Secondary | ICD-10-CM | POA: Diagnosis not present

## 2022-08-23 DIAGNOSIS — F1220 Cannabis dependence, uncomplicated: Secondary | ICD-10-CM | POA: Diagnosis not present

## 2022-08-23 DIAGNOSIS — F1420 Cocaine dependence, uncomplicated: Secondary | ICD-10-CM | POA: Diagnosis not present

## 2022-08-23 DIAGNOSIS — R69 Illness, unspecified: Secondary | ICD-10-CM | POA: Diagnosis not present

## 2022-08-27 DIAGNOSIS — S161XXA Strain of muscle, fascia and tendon at neck level, initial encounter: Secondary | ICD-10-CM | POA: Diagnosis not present

## 2022-08-27 DIAGNOSIS — M542 Cervicalgia: Secondary | ICD-10-CM | POA: Diagnosis not present

## 2022-08-27 DIAGNOSIS — R293 Abnormal posture: Secondary | ICD-10-CM | POA: Diagnosis not present

## 2022-08-28 DIAGNOSIS — R69 Illness, unspecified: Secondary | ICD-10-CM | POA: Diagnosis not present

## 2022-08-28 DIAGNOSIS — F1220 Cannabis dependence, uncomplicated: Secondary | ICD-10-CM | POA: Diagnosis not present

## 2022-08-28 DIAGNOSIS — F1420 Cocaine dependence, uncomplicated: Secondary | ICD-10-CM | POA: Diagnosis not present

## 2022-08-29 DIAGNOSIS — M542 Cervicalgia: Secondary | ICD-10-CM | POA: Diagnosis not present

## 2022-08-29 DIAGNOSIS — R293 Abnormal posture: Secondary | ICD-10-CM | POA: Diagnosis not present

## 2022-08-29 DIAGNOSIS — S161XXA Strain of muscle, fascia and tendon at neck level, initial encounter: Secondary | ICD-10-CM | POA: Diagnosis not present

## 2022-08-30 DIAGNOSIS — F1420 Cocaine dependence, uncomplicated: Secondary | ICD-10-CM | POA: Diagnosis not present

## 2022-08-30 DIAGNOSIS — F1220 Cannabis dependence, uncomplicated: Secondary | ICD-10-CM | POA: Diagnosis not present

## 2022-08-30 DIAGNOSIS — R69 Illness, unspecified: Secondary | ICD-10-CM | POA: Diagnosis not present

## 2022-09-06 DIAGNOSIS — R69 Illness, unspecified: Secondary | ICD-10-CM | POA: Diagnosis not present

## 2022-09-06 DIAGNOSIS — F1420 Cocaine dependence, uncomplicated: Secondary | ICD-10-CM | POA: Diagnosis not present

## 2022-09-06 DIAGNOSIS — F1220 Cannabis dependence, uncomplicated: Secondary | ICD-10-CM | POA: Diagnosis not present

## 2022-09-11 DIAGNOSIS — M542 Cervicalgia: Secondary | ICD-10-CM | POA: Diagnosis not present

## 2022-09-11 DIAGNOSIS — F1220 Cannabis dependence, uncomplicated: Secondary | ICD-10-CM | POA: Diagnosis not present

## 2022-09-11 DIAGNOSIS — F1420 Cocaine dependence, uncomplicated: Secondary | ICD-10-CM | POA: Diagnosis not present

## 2022-09-11 DIAGNOSIS — S161XXA Strain of muscle, fascia and tendon at neck level, initial encounter: Secondary | ICD-10-CM | POA: Diagnosis not present

## 2022-09-11 DIAGNOSIS — R69 Illness, unspecified: Secondary | ICD-10-CM | POA: Diagnosis not present

## 2022-09-11 DIAGNOSIS — R293 Abnormal posture: Secondary | ICD-10-CM | POA: Diagnosis not present

## 2022-09-12 DIAGNOSIS — R69 Illness, unspecified: Secondary | ICD-10-CM | POA: Diagnosis not present

## 2022-09-12 DIAGNOSIS — F1220 Cannabis dependence, uncomplicated: Secondary | ICD-10-CM | POA: Diagnosis not present

## 2022-09-12 DIAGNOSIS — F1420 Cocaine dependence, uncomplicated: Secondary | ICD-10-CM | POA: Diagnosis not present

## 2022-09-19 DIAGNOSIS — R69 Illness, unspecified: Secondary | ICD-10-CM | POA: Diagnosis not present

## 2022-09-19 DIAGNOSIS — F1420 Cocaine dependence, uncomplicated: Secondary | ICD-10-CM | POA: Diagnosis not present

## 2022-09-19 DIAGNOSIS — F1220 Cannabis dependence, uncomplicated: Secondary | ICD-10-CM | POA: Diagnosis not present

## 2022-09-20 DIAGNOSIS — R69 Illness, unspecified: Secondary | ICD-10-CM | POA: Diagnosis not present

## 2022-09-20 DIAGNOSIS — F1420 Cocaine dependence, uncomplicated: Secondary | ICD-10-CM | POA: Diagnosis not present

## 2022-09-20 DIAGNOSIS — F1220 Cannabis dependence, uncomplicated: Secondary | ICD-10-CM | POA: Diagnosis not present

## 2022-09-27 DIAGNOSIS — F1220 Cannabis dependence, uncomplicated: Secondary | ICD-10-CM | POA: Diagnosis not present

## 2022-09-27 DIAGNOSIS — R69 Illness, unspecified: Secondary | ICD-10-CM | POA: Diagnosis not present

## 2022-09-27 DIAGNOSIS — F1420 Cocaine dependence, uncomplicated: Secondary | ICD-10-CM | POA: Diagnosis not present

## 2022-10-02 ENCOUNTER — Ambulatory Visit: Payer: 59 | Admitting: Internal Medicine

## 2022-10-02 ENCOUNTER — Encounter: Payer: Self-pay | Admitting: Internal Medicine

## 2022-10-02 VITALS — BP 130/76 | HR 60 | Ht 66.0 in | Wt 148.0 lb

## 2022-10-02 DIAGNOSIS — R7989 Other specified abnormal findings of blood chemistry: Secondary | ICD-10-CM

## 2022-10-02 NOTE — Progress Notes (Unsigned)
Name: Collin Horne  MRN/ DOB: [id number], 06/09/1966    Age/ Sex: 56 y.o., male    PCP: Etta Grandchild, MD   Reason for Endocrinology Evaluation: Hypogonadism     Date of Initial Endocrinology Evaluation: 10/02/2022     HPI: [name] is a 56 y.o. male with a past medical history of Hx of ETOH abuse, ED , anxiety and depression. The patient presented for initial endocrinology clinic visit on 10/02/2022 for consultative assistance with his Hypogonadism.   Patient has been noted with low free testosterone  at 45.8 PG/mL  (reference 46-224 ) but with normal total testosterone at 499 NG/DL.  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 He has a 30.00 pack-year smoking history. He has never used smokeless tobacco. He reports that he does not currently use alcohol. He reports that he does not use drugs. Family History: family history includes Alcohol abuse in his father; Arthritis in his mother; Depression in his father and sister; Early death in his  father.   HOME MEDICATIONS: Allergies as of 10/02/2022   No Known Allergies      Medication List        Accurate as of October 02, 2022 11:15 AM. If you have any questions, ask your nurse or doctor.          STOP taking these medications    diclofenac 75 MG EC tablet Commonly known as: VOLTAREN Stopped by: Scarlette Shorts, MD   ketoconazole 2 % cream Commonly known as: NIZORAL Stopped by: Scarlette Shorts, MD   triamcinolone cream 0.5 % Commonly known as: KENALOG Stopped by: Scarlette Shorts, MD       TAKE these medications    desvenlafaxine 100 MG 24 hr tablet Commonly known as: PRISTIQ Take 100 mg by mouth daily.   gabapentin 600 MG tablet Commonly known as: NEURONTIN Take 800 mg by mouth 3 (three) times daily before meals.   LaMICtal 150 MG tablet Generic drug: lamoTRIgine Take 100 mg by mouth daily.   sildenafil 100 MG tablet Commonly known as: Viagra Take 1 tablet (100 mg total) by mouth daily as needed for erectile dysfunction.          REVIEW OF SYSTEMS: A comprehensive ROS was conducted with the patient and is negative except as per HPI and below:  ROS  OBJECTIVE:  VS: BP 130/76 (BP Location: Left Arm, Patient Position: Sitting, Cuff Size: Small)   Pulse 60   Ht 5\' 6"  (1.676 m)   Wt 148 lb (67.1 kg)   SpO2 99%   BMI 23.89 kg/m    Wt Readings from Last 3 Encounters:  10/02/22 148 lb (67.1 kg)  06/12/22 153 lb 6 oz (69.6 kg)  05/21/22 154 lb 4 oz (70 kg)     EXAM: General: Pt appears well and is in NAD  Eyes: External eye exam normal without stare, lid lag or exophthalmos.  EOM intact.  PERRL.  Neck: General: Supple without adenopathy. Thyroid: Thyroid size normal.  No goiter or nodules appreciated.  Chest:  No Gynecomastia  Lungs: Clear with good BS bilat with no rales, rhonchi, or wheezes  Heart: Auscultation: RRR.  Abdomen: Normoactive bowel sounds, soft, nontender, without masses or organomegaly  palpable  Genital exam: Normal testicular exam volume 25 mL   Extremities:  BL LE: No pretibial edema normal ROM and strength.  Mental Status: Judgment, insight: Intact Orientation: Oriented to time, place, and person Mood and affect: No depression, anxiety, or agitation     DATA REVIEWED: ***    ASSESSMENT/PLAN/RECOMMENDATIONS:   Low testosterone:  - Pt with hx of growth hormone deficiency as a child, requiring growth hormone for ~ 4 years  - Testicular exam is normal today with normal volumes  -We discussed side effects of testosterone therapy such as erythropoiesis, worsening of sleep apnea that is severe and untreated,  Prostate volumes and serum PSA increase in response to testosterone treatment which might increase BPH and worsen urinary outflow obstruction as well as prostate cancer risk.  - We also discussed there is a possibility of increased cardiovascular risk associated with testosterone use. - Will NOT use his total testosterone as his SHBG is elevated , will proceed with free testosterone as well as LH, TFT, ACTH and cortisol    Signed electronically by: 07/22/22, MD  [facility]  [facility] [address], [address] [place], [place] Phone: [phone] FAX: [phone]   CC: [phone], MD [address] [place] Phone: [phone] Fax: [phone]   Return to Endocrinology clinic as below: No future appointments.

## 2022-10-11 DIAGNOSIS — R69 Illness, unspecified: Secondary | ICD-10-CM | POA: Diagnosis not present

## 2022-10-11 DIAGNOSIS — F1220 Cannabis dependence, uncomplicated: Secondary | ICD-10-CM | POA: Diagnosis not present

## 2022-10-11 DIAGNOSIS — F1420 Cocaine dependence, uncomplicated: Secondary | ICD-10-CM | POA: Diagnosis not present

## 2022-10-18 DIAGNOSIS — F1420 Cocaine dependence, uncomplicated: Secondary | ICD-10-CM | POA: Diagnosis not present

## 2022-10-18 DIAGNOSIS — F1220 Cannabis dependence, uncomplicated: Secondary | ICD-10-CM | POA: Diagnosis not present

## 2022-10-18 DIAGNOSIS — R69 Illness, unspecified: Secondary | ICD-10-CM | POA: Diagnosis not present

## 2022-10-24 ENCOUNTER — Other Ambulatory Visit (INDEPENDENT_AMBULATORY_CARE_PROVIDER_SITE_OTHER): Payer: 59

## 2022-10-24 DIAGNOSIS — R7989 Other specified abnormal findings of blood chemistry: Secondary | ICD-10-CM | POA: Diagnosis not present

## 2022-10-24 LAB — COMPREHENSIVE METABOLIC PANEL
ALT: 15 U/L (ref 0–53)
AST: 18 U/L (ref 0–37)
Albumin: 4.8 g/dL (ref 3.5–5.2)
Alkaline Phosphatase: 66 U/L (ref 39–117)
BUN: 8 mg/dL (ref 6–23)
CO2: 33 mEq/L — ABNORMAL HIGH (ref 19–32)
Calcium: 10 mg/dL (ref 8.4–10.5)
Chloride: 100 mEq/L (ref 96–112)
Creatinine, Ser: 1.03 mg/dL (ref 0.40–1.50)
GFR: 81.41 mL/min (ref >60.00)
Glucose, Bld: 101 mg/dL — ABNORMAL HIGH (ref 70–99)
Potassium: 3.4 mEq/L — ABNORMAL LOW (ref 3.5–5.1)
Sodium: 142 mEq/L (ref 135–145)
Total Bilirubin: 0.6 mg/dL (ref 0.2–1.2)
Total Protein: 7.9 g/dL (ref 6.0–8.3)

## 2022-10-24 LAB — TSH: TSH: 0.86 u[IU]/mL (ref 0.35–5.50)

## 2022-10-24 LAB — LUTEINIZING HORMONE: LH: 1.49 m[IU]/mL — ABNORMAL LOW (ref 1.50–9.30)

## 2022-10-24 LAB — CBC
HCT: 39.1 % (ref 39.0–52.0)
Hemoglobin: 13.3 g/dL (ref 13.0–17.0)
MCHC: 34.1 g/dL (ref 30.0–36.0)
MCV: 93.8 fl (ref 78.0–100.0)
Platelets: 307 10*3/uL (ref 150.0–400.0)
RBC: 4.17 Mil/uL — ABNORMAL LOW (ref 4.22–5.81)
RDW: 13.7 % (ref 11.5–15.5)
WBC: 7.1 10*3/uL (ref 4.0–10.5)

## 2022-10-24 LAB — PSA: PSA: 0.74 ng/mL (ref 0.10–4.00)

## 2022-10-24 LAB — CORTISOL: Cortisol, Plasma: 10.3 ug/dL

## 2022-10-24 LAB — T4, FREE: Free T4: 0.86 ng/dL (ref 0.60–1.60)

## 2022-10-25 DIAGNOSIS — R69 Illness, unspecified: Secondary | ICD-10-CM | POA: Diagnosis not present

## 2022-10-25 DIAGNOSIS — F1420 Cocaine dependence, uncomplicated: Secondary | ICD-10-CM | POA: Diagnosis not present

## 2022-10-25 DIAGNOSIS — F1220 Cannabis dependence, uncomplicated: Secondary | ICD-10-CM | POA: Diagnosis not present

## 2022-10-28 LAB — ACTH: C206 ACTH: 35 pg/mL (ref 6–50)

## 2022-10-28 LAB — PROLACTIN: Prolactin: 6.2 ng/mL (ref 2.0–18.0)

## 2022-10-30 LAB — TESTOSTERONE FREE MS/DIALYSIS
Free Testosterone, Serum: 59 pg/mL
Testosterone, Serum (Total): 654 ng/dL
Testosterone-% Free: 0.9 %

## 2022-11-01 DIAGNOSIS — R69 Illness, unspecified: Secondary | ICD-10-CM | POA: Diagnosis not present

## 2022-11-01 DIAGNOSIS — F1420 Cocaine dependence, uncomplicated: Secondary | ICD-10-CM | POA: Diagnosis not present

## 2022-11-01 DIAGNOSIS — F1220 Cannabis dependence, uncomplicated: Secondary | ICD-10-CM | POA: Diagnosis not present

## 2022-11-08 DIAGNOSIS — F1420 Cocaine dependence, uncomplicated: Secondary | ICD-10-CM | POA: Diagnosis not present

## 2022-11-08 DIAGNOSIS — F1220 Cannabis dependence, uncomplicated: Secondary | ICD-10-CM | POA: Diagnosis not present

## 2022-11-08 DIAGNOSIS — R69 Illness, unspecified: Secondary | ICD-10-CM | POA: Diagnosis not present

## 2022-12-24 ENCOUNTER — Ambulatory Visit (INDEPENDENT_AMBULATORY_CARE_PROVIDER_SITE_OTHER): Payer: 59 | Admitting: Internal Medicine

## 2022-12-24 ENCOUNTER — Encounter: Payer: Self-pay | Admitting: Internal Medicine

## 2022-12-24 VITALS — BP 166/94 | HR 74 | Temp 98.1°F | Ht 66.0 in | Wt 155.0 lb

## 2022-12-24 DIAGNOSIS — L219 Seborrheic dermatitis, unspecified: Secondary | ICD-10-CM | POA: Diagnosis not present

## 2022-12-24 DIAGNOSIS — I10 Essential (primary) hypertension: Secondary | ICD-10-CM | POA: Diagnosis not present

## 2022-12-24 DIAGNOSIS — Z23 Encounter for immunization: Secondary | ICD-10-CM

## 2022-12-24 DIAGNOSIS — E781 Pure hyperglyceridemia: Secondary | ICD-10-CM

## 2022-12-24 DIAGNOSIS — D510 Vitamin B12 deficiency anemia due to intrinsic factor deficiency: Secondary | ICD-10-CM | POA: Diagnosis not present

## 2022-12-24 DIAGNOSIS — E785 Hyperlipidemia, unspecified: Secondary | ICD-10-CM

## 2022-12-24 DIAGNOSIS — Z0001 Encounter for general adult medical examination with abnormal findings: Secondary | ICD-10-CM | POA: Diagnosis not present

## 2022-12-24 DIAGNOSIS — E876 Hypokalemia: Secondary | ICD-10-CM | POA: Insufficient documentation

## 2022-12-24 DIAGNOSIS — Z1211 Encounter for screening for malignant neoplasm of colon: Secondary | ICD-10-CM

## 2022-12-24 MED ORDER — KETOCONAZOLE 2 % EX SHAM: 1.0000 | MEDICATED_SHAMPOO | CUTANEOUS | 1 refills | Status: DC

## 2022-12-24 NOTE — Progress Notes (Unsigned)
Subjective:  Patient ID: Collin Horne, male    DOB: 05/09/1966  Age: 57 y.o. MRN: [id number]  CC: Annual Exam and Hypertension   HPI Collin Horne presents for a CPX and f/up -  He is not very active.  With minimal activity he has good endurance and denies chest pain, shortness of breath, diaphoresis, or edema.  He complains of chronic insomnia and takes a combination of melatonin and trazodone.  Outpatient Medications Prior to Visit  Medication Sig Dispense Refill   desvenlafaxine (PRISTIQ) 100 MG 24 hr tablet Take 100 mg by mouth daily.     gabapentin (NEURONTIN) 600 MG tablet Take 800 mg by mouth 3 (three) times daily before meals.     lamoTRIgine (LAMICTAL) 150 MG tablet Take 100 mg by mouth daily.     sildenafil (VIAGRA) 100 MG tablet Take 1 tablet (100 mg total) by mouth daily as needed for erectile dysfunction. 30 tablet 0   No facility-administered medications prior to visit.    ROS Review of Systems  Constitutional: Negative.  Negative for diaphoresis and fatigue.  HENT: Negative.    Respiratory: Negative.  Negative for cough, chest tightness, shortness of breath and wheezing.   Cardiovascular:  Negative for chest pain, palpitations and leg swelling.  Gastrointestinal: Negative.  Negative for abdominal pain, constipation, diarrhea, nausea and vomiting.  Genitourinary: Negative.  Negative for difficulty urinating and dysuria.  Musculoskeletal: Negative.  Negative for myalgias.  Skin:  Positive for pallor. Negative for color change and rash.  Neurological:  Negative for dizziness, weakness and headaches.  Hematological:  Negative for adenopathy. Does not bruise/bleed easily.  Psychiatric/Behavioral:  Positive for sleep disturbance. Negative for behavioral problems, confusion, decreased concentration, dysphoric mood and suicidal ideas. The patient is nervous/anxious.     Objective:  BP (!) 166/94 (BP Location: Right Arm, Patient Position: Sitting, Cuff Size: Large)    Pulse 74   Temp 98.1 F (36.7 C) (Oral)   Ht 5' 6"$  (1.676 m)   Wt 155 lb (70.3 kg)   SpO2 96%   BMI 25.02 kg/m   BP Readings from Last 3 Encounters:  12/24/22 (!) 166/94  10/02/22 130/76  06/12/22 120/74    Wt Readings from Last 3 Encounters:  12/24/22 155 lb (70.3 kg)  10/02/22 148 lb (67.1 kg)  06/12/22 153 lb 6 oz (69.6 kg)    Physical Exam Vitals reviewed.  HENT:     Nose: Nose normal.     Mouth/Throat:     Mouth: Mucous membranes are moist.  Eyes:     General: No scleral icterus.    Conjunctiva/sclera: Conjunctivae normal.  Cardiovascular:     Rate and Rhythm: Normal rate and regular rhythm.     Heart sounds: Normal heart sounds, S1 normal and S2 normal. No murmur heard.    Comments: EKG- NSR, 63 bpm No LVH or Q waves Normal EKG Pulmonary:     Effort: Pulmonary effort is normal.     Breath sounds: No stridor. No wheezing, rhonchi or rales.  Abdominal:     General: Abdomen is flat.     Palpations: There is no mass.     Tenderness: There is no abdominal tenderness. There is no guarding.     Hernia: No hernia is present.  Musculoskeletal:     Cervical back: Neck supple.     Right lower leg: No edema.     Left lower leg: No edema.  Lymphadenopathy:     Cervical: No cervical adenopathy.  Skin:  General: Skin is warm and dry.     Findings: Rash present.  Neurological:     General: No focal deficit present.     Mental Status: He is alert. Mental status is at baseline.  Psychiatric:        Attention and Perception: He is inattentive.        Mood and Affect: Mood is anxious. Mood is not depressed. Affect is not flat or tearful.        Speech: He is communicative. Speech is tangential. Speech is not rapid and pressured or delayed.        Behavior: Behavior normal.        Thought Content: Thought content is not paranoid or delusional. Thought content does not include homicidal or suicidal ideation.        Cognition and Memory: Cognition is impaired. Memory  is impaired.     Lab Results  Component Value Date   WBC 6.3 12/24/2022   HGB 12.7 (L) 12/24/2022   HCT 37.1 (L) 12/24/2022   PLT 314.0 12/24/2022   GLUCOSE 83 12/24/2022   CHOL 189 12/24/2022   TRIG 89.0 12/24/2022   HDL 66.40 12/24/2022   LDLDIRECT 70.0 12/21/2021   LDLCALC 104 (H) 12/24/2022   ALT 15 10/24/2022   AST 18 10/24/2022   NA 138 12/24/2022   K 3.7 12/24/2022   CL 98 12/24/2022   CREATININE 1.06 12/24/2022   BUN 7 12/24/2022   CO2 29 12/24/2022   TSH 0.86 10/24/2022   PSA 0.74 10/24/2022    CT CHEST LUNG CA SCREEN LOW DOSE W/O CM  Result Date: 03/03/2022 CLINICAL DATA:  57 year old male with 32 pack-year history of smoking. Lung cancer screening. EXAM: CT CHEST WITHOUT CONTRAST LOW-DOSE FOR LUNG CANCER SCREENING TECHNIQUE: Multidetector CT imaging of the chest was performed following the standard protocol without IV contrast. RADIATION DOSE REDUCTION: This exam was performed according to the departmental dose-optimization program which includes automated exposure control, adjustment of the mA and/or kV according to patient size and/or use of iterative reconstruction technique. COMPARISON:  None. FINDINGS: Cardiovascular: The heart size is normal. No substantial pericardial effusion. Coronary artery calcification is evident. Mild atherosclerotic calcification is noted in the wall of the thoracic aorta. Mediastinum/Nodes: No mediastinal lymphadenopathy. No evidence for gross hilar lymphadenopathy although assessment is limited by the lack of intravenous contrast on the current study. The esophagus has normal imaging features. There is no axillary lymphadenopathy. Lungs/Pleura: Subtle changes of centrilobular emphysema noted in the lung apices. Calcified granuloma noted left upper lobe. No suspicious pulmonary nodule or mass. No focal airspace consolidation. No pleural effusion. Upper Abdomen: Unremarkable. Musculoskeletal: No worrisome lytic or sclerotic osseous abnormality.  IMPRESSION: 1. Lung-RADS 1, negative. Continue annual screening with low-dose chest CT without contrast in 12 months. 2. Aortic Atherosclerosis (ICD10-I70.0) and Emphysema (ICD10-J43.9). Electronically Signed   By: Misty Stanley M.D.   On: 03/03/2022 12:02   Assessment & Plan:   Xzayvier was seen today for annual exam and hypertension.  Diagnoses and all orders for this visit:  Hypertension, unspecified type- His blood pressure is not at goal.  Will check labs to screen for secondary causes and endorgan damage.  EKG is negative for LVH.  Will start an ARB. -     EKG 12-Lead -     Aldosterone + renin activity w/ ratio; Future -     Basic metabolic panel; Future -     Urinalysis, Routine w reflex microscopic; Future -  Urinalysis, Routine w reflex microscopic -     Basic metabolic panel -     Aldosterone + renin activity w/ ratio -     olmesartan (BENICAR) 20 MG tablet; Take 1 tablet (20 mg total) by mouth daily.  Chronic hypokalemia -     Magnesium; Future -     Aldosterone + renin activity w/ ratio; Future -     Basic metabolic panel; Future -     Basic metabolic panel -     Aldosterone + renin activity w/ ratio -     Magnesium  Pure hyperglyceridemia -     Lipid panel; Future -     Lipid panel  Vitamin B12 deficiency anemia due to intrinsic factor deficiency- I have asked him to restart parenteral B12 replacement therapy. -     CBC with Differential/Platelet; Future -     Vitamin B12; Future -     Folate; Future -     Folate -     Vitamin B12 -     CBC with Differential/Platelet  Encounter for general adult medical examination with abnormal findings- Exam completed, labs reviewed, vaccines reviewed and updated, cancer screenings addressed, patient education was given.  Seborrheic dermatitis -     ketoconazole (NIZORAL) 2 % shampoo; Apply 1 Application topically 2 (two) times a week.  Dyslipidemia, goal LDL below 70- Will start a statin for cardiovascular risk  reduction. -     rosuvastatin (CRESTOR) 10 MG tablet; Take 1 tablet (10 mg total) by mouth daily.  Colon cancer screening -     Cologuard  Other orders -     Zoster Recombinant (Shingrix )   I am having Collin Rudd Bryk "Ray" start on ketoconazole, olmesartan, and rosuvastatin. I am also having him maintain his gabapentin, desvenlafaxine, lamoTRIgine, and sildenafil.  Meds ordered this encounter  Medications   ketoconazole (NIZORAL) 2 % shampoo    Sig: Apply 1 Application topically 2 (two) times a week.    Dispense:  120 mL    Refill:  1   olmesartan (BENICAR) 20 MG tablet    Sig: Take 1 tablet (20 mg total) by mouth daily.    Dispense:  90 tablet    Refill:  0   rosuvastatin (CRESTOR) 10 MG tablet    Sig: Take 1 tablet (10 mg total) by mouth daily.    Dispense:  90 tablet    Refill:  1     Follow-up: Return in about 3 months (around 03/24/2023).  Scarlette Calico, MD

## 2022-12-24 NOTE — Patient Instructions (Signed)
Hypertension, Adult High blood pressure (hypertension) is when the force of blood pumping through the arteries is too strong. The arteries are the blood vessels that carry blood from the heart throughout the body. Hypertension forces the heart to work harder to pump blood and may cause arteries to become narrow or stiff. Untreated or uncontrolled hypertension can lead to a heart attack, heart failure, a stroke, kidney disease, and other problems. A blood pressure reading consists of a higher number over a lower number. Ideally, your blood pressure should be below 120/80. The first ("top") number is called the systolic pressure. It is a measure of the pressure in your arteries as your heart beats. The second ("bottom") number is called the diastolic pressure. It is a measure of the pressure in your arteries as the heart relaxes. What are the causes? The exact cause of this condition is not known. There are some conditions that result in high blood pressure. What increases the risk? Certain factors may make you more likely to develop high blood pressure. Some of these risk factors are under your control, including: Smoking. Not getting enough exercise or physical activity. Being overweight. Having too much fat, sugar, calories, or salt (sodium) in your diet. Drinking too much alcohol. Other risk factors include: Having a personal history of heart disease, diabetes, high cholesterol, or kidney disease. Stress. Having a family history of high blood pressure and high cholesterol. Having obstructive sleep apnea. Age. The risk increases with age. What are the signs or symptoms? High blood pressure may not cause symptoms. Very high blood pressure (hypertensive crisis) may cause: Headache. Fast or irregular heartbeats (palpitations). Shortness of breath. Nosebleed. Nausea and vomiting. Vision changes. Severe chest pain, dizziness, and seizures. How is this diagnosed? This condition is diagnosed by  measuring your blood pressure while you are seated, with your arm resting on a flat surface, your legs uncrossed, and your feet flat on the floor. The cuff of the blood pressure monitor will be placed directly against the skin of your upper arm at the level of your heart. Blood pressure should be measured at least twice using the same arm. Certain conditions can cause a difference in blood pressure between your right and left arms. If you have a high blood pressure reading during one visit or you have normal blood pressure with other risk factors, you may be asked to: Return on a different day to have your blood pressure checked again. Monitor your blood pressure at home for 1 week or longer. If you are diagnosed with hypertension, you may have other blood or imaging tests to help your health care provider understand your overall risk for other conditions. How is this treated? This condition is treated by making healthy lifestyle changes, such as eating healthy foods, exercising more, and reducing your alcohol intake. You may be referred for counseling on a healthy diet and physical activity. Your health care provider may prescribe medicine if lifestyle changes are not enough to get your blood pressure under control and if: Your systolic blood pressure is above 130. Your diastolic blood pressure is above 80. Your personal target blood pressure may vary depending on your medical conditions, your age, and other factors. Follow these instructions at home: Eating and drinking  Eat a diet that is high in fiber and potassium, and low in sodium, added sugar, and fat. An example of this eating plan is called the DASH diet. DASH stands for Dietary Approaches to Stop Hypertension. To eat this way: Eat   plenty of fresh fruits and vegetables. Try to fill one half of your plate at each meal with fruits and vegetables. Eat whole grains, such as whole-wheat pasta, brown rice, or whole-grain bread. Fill about one  fourth of your plate with whole grains. Eat or drink low-fat dairy products, such as skim milk or low-fat yogurt. Avoid fatty cuts of meat, processed or cured meats, and poultry with skin. Fill about one fourth of your plate with lean proteins, such as fish, chicken without skin, beans, eggs, or tofu. Avoid pre-made and processed foods. These tend to be higher in sodium, added sugar, and fat. Reduce your daily sodium intake. Many people with hypertension should eat less than 1,500 mg of sodium a day. Do not drink alcohol if: Your health care provider tells you not to drink. You are pregnant, may be pregnant, or are planning to become pregnant. If you drink alcohol: Limit how much you have to: 0-1 drink a day for women. 0-2 drinks a day for men. Know how much alcohol is in your drink. In the [place] one drink equals one 12 oz bottle of beer (355 mL), one 5 oz glass of wine (148 mL), or one 1 oz glass of hard liquor (44 mL). Lifestyle  Work with your health care provider to maintain a healthy body weight or to lose weight. Ask what an ideal weight is for you. Get at least 30 minutes of exercise that causes your heart to beat faster (aerobic exercise) most days of the week. Activities may include walking, swimming, or biking. Include exercise to strengthen your muscles (resistance exercise), such as Pilates or lifting weights, as part of your weekly exercise routine. Try to do these types of exercises for 30 minutes at least 3 days a week. Do not use any products that contain nicotine or tobacco. These products include cigarettes, chewing tobacco, and vaping devices, such as e-cigarettes. If you need help quitting, ask your health care provider. Monitor your blood pressure at home as told by your health care provider. Keep all follow-up visits. This is important. Medicines Take over-the-counter and prescription medicines only as told by your health care provider. Follow directions carefully. Blood  pressure medicines must be taken as prescribed. Do not skip doses of blood pressure medicine. Doing this puts you at risk for problems and can make the medicine less effective. Ask your health care provider about side effects or reactions to medicines that you should watch for. Contact a health care provider if you: Think you are having a reaction to a medicine you are taking. Have headaches that keep coming back (recurring). Feel dizzy. Have swelling in your ankles. Have trouble with your vision. Get help right away if you: Develop a severe headache or confusion. Have unusual weakness or numbness. Feel faint. Have severe pain in your chest or abdomen. Vomit repeatedly. Have trouble breathing. These symptoms may be an emergency. Get help right away. Call 911. Do not wait to see if the symptoms will go away. Do not drive yourself to the hospital. Summary Hypertension is when the force of blood pumping through your arteries is too strong. If this condition is not controlled, it may put you at risk for serious complications. Your personal target blood pressure may vary depending on your medical conditions, your age, and other factors. For most people, a normal blood pressure is less than 120/80. Hypertension is treated with lifestyle changes, medicines, or a combination of both. Lifestyle changes include losing weight, eating a healthy,   low-sodium diet, exercising more, and limiting alcohol. This information is not intended to replace advice given to you by your health care provider. Make sure you discuss any questions you have with your health care provider. Document Revised: 09/05/2021 Document Reviewed: 09/05/2021 Elsevier Patient Education  2023 [employer] Inc[employer]  

## 2022-12-25 ENCOUNTER — Encounter: Payer: Self-pay | Admitting: Internal Medicine

## 2022-12-25 DIAGNOSIS — E785 Hyperlipidemia, unspecified: Secondary | ICD-10-CM | POA: Insufficient documentation

## 2022-12-25 DIAGNOSIS — Z1211 Encounter for screening for malignant neoplasm of colon: Secondary | ICD-10-CM | POA: Insufficient documentation

## 2022-12-25 LAB — LIPID PANEL
Cholesterol: 189 mg/dL (ref 0–200)
HDL: 66.4 mg/dL (ref >39.00)
LDL Cholesterol: 104 mg/dL — ABNORMAL HIGH (ref 0–99)
NonHDL: 122.25
Total CHOL/HDL Ratio: 3
Triglycerides: 89 mg/dL (ref 0.0–149.0)
VLDL: 17.8 mg/dL (ref 0.0–40.0)

## 2022-12-25 LAB — URINALYSIS, ROUTINE W REFLEX MICROSCOPIC
Bilirubin Urine: NEGATIVE
Hgb urine dipstick: NEGATIVE
Ketones, ur: NEGATIVE
Leukocytes,Ua: NEGATIVE
Nitrite: NEGATIVE
RBC / HPF: NONE SEEN (ref 0–?)
Specific Gravity, Urine: 1.01 (ref 1.000–1.030)
Total Protein, Urine: NEGATIVE
Urine Glucose: NEGATIVE
Urobilinogen, UA: 0.2 (ref 0.0–1.0)
WBC, UA: NONE SEEN (ref 0–?)
pH: 6 (ref 5.0–8.0)

## 2022-12-25 LAB — CBC WITH DIFFERENTIAL/PLATELET
Basophils Absolute: 0.1 10*3/uL (ref 0.0–0.1)
Basophils Relative: 1 % (ref 0.0–3.0)
Eosinophils Absolute: 0.2 10*3/uL (ref 0.0–0.7)
Eosinophils Relative: 3.6 % (ref 0.0–5.0)
HCT: 37.1 % — ABNORMAL LOW (ref 39.0–52.0)
Hemoglobin: 12.7 g/dL — ABNORMAL LOW (ref 13.0–17.0)
Lymphocytes Relative: 40 % (ref 12.0–46.0)
Lymphs Abs: 2.5 10*3/uL (ref 0.7–4.0)
MCHC: 34.4 g/dL (ref 30.0–36.0)
MCV: 93.1 fl (ref 78.0–100.0)
Monocytes Absolute: 0.5 10*3/uL (ref 0.1–1.0)
Monocytes Relative: 8.3 % (ref 3.0–12.0)
Neutro Abs: 3 10*3/uL (ref 1.4–7.7)
Neutrophils Relative %: 47.1 % (ref 43.0–77.0)
Platelets: 314 10*3/uL (ref 150.0–400.0)
RBC: 3.98 Mil/uL — ABNORMAL LOW (ref 4.22–5.81)
RDW: 13.6 % (ref 11.5–15.5)
WBC: 6.3 10*3/uL (ref 4.0–10.5)

## 2022-12-25 LAB — BASIC METABOLIC PANEL
BUN: 7 mg/dL (ref 6–23)
CO2: 29 mEq/L (ref 19–32)
Calcium: 9.7 mg/dL (ref 8.4–10.5)
Chloride: 98 mEq/L (ref 96–112)
Creatinine, Ser: 1.06 mg/dL (ref 0.40–1.50)
GFR: 78.56 mL/min (ref >60.00)
Glucose, Bld: 83 mg/dL (ref 70–99)
Potassium: 3.7 mEq/L (ref 3.5–5.1)
Sodium: 138 mEq/L (ref 135–145)

## 2022-12-25 LAB — FOLATE: Folate: 18.4 ng/mL (ref >5.9)

## 2022-12-25 LAB — MAGNESIUM: Magnesium: 1.9 mg/dL (ref 1.5–2.5)

## 2022-12-25 LAB — VITAMIN B12: Vitamin B-12: 194 pg/mL — ABNORMAL LOW (ref 211–911)

## 2022-12-25 MED ORDER — OLMESARTAN MEDOXOMIL 20 MG PO TABS: 20.0000 mg | ORAL_TABLET | Freq: Every day | ORAL | 0 refills | Status: DC

## 2022-12-25 MED ORDER — ROSUVASTATIN CALCIUM 10 MG PO TABS: 10.0000 mg | ORAL_TABLET | Freq: Every day | ORAL | 1 refills | Status: DC

## 2022-12-28 ENCOUNTER — Telehealth: Payer: Self-pay | Admitting: Internal Medicine

## 2022-12-28 ENCOUNTER — Ambulatory Visit (INDEPENDENT_AMBULATORY_CARE_PROVIDER_SITE_OTHER): Payer: 59

## 2022-12-28 ENCOUNTER — Other Ambulatory Visit: Payer: Self-pay | Admitting: Internal Medicine

## 2022-12-28 DIAGNOSIS — D510 Vitamin B12 deficiency anemia due to intrinsic factor deficiency: Secondary | ICD-10-CM

## 2022-12-28 DIAGNOSIS — E785 Hyperlipidemia, unspecified: Secondary | ICD-10-CM

## 2022-12-28 MED ORDER — CYANOCOBALAMIN 1000 MCG/ML IJ SOLN
1000.0000 ug | Freq: Once | INTRAMUSCULAR | Status: AC
  Administered 2022-12-28: 1000 ug via INTRAMUSCULAR

## 2022-12-28 MED ORDER — ATORVASTATIN CALCIUM 20 MG PO TABS: 20.0000 mg | ORAL_TABLET | Freq: Every day | ORAL | 1 refills | Status: DC

## 2022-12-28 NOTE — Telephone Encounter (Signed)
Patient states the prior authorization for rosuvastatin (crestor) 76m was denied, he asked if a similar medicine could be prescribed for him.

## 2022-12-28 NOTE — Progress Notes (Signed)
Pt here for monthly B12 injection per Dr. Jones  B12 1000mcg given IM, and pt tolerated injection well.   

## 2022-12-29 LAB — ALDOSTERONE + RENIN ACTIVITY W/ RATIO
Aldosterone: <1 ng/dL
Renin Activity: 0.17 ng/mL/h — ABNORMAL LOW (ref 0.25–5.82)

## 2023-01-13 ENCOUNTER — Other Ambulatory Visit: Payer: Self-pay | Admitting: Acute Care

## 2023-01-13 DIAGNOSIS — Z122 Encounter for screening for malignant neoplasm of respiratory organs: Secondary | ICD-10-CM

## 2023-01-13 DIAGNOSIS — Z87891 Personal history of nicotine dependence: Secondary | ICD-10-CM

## 2023-01-15 ENCOUNTER — Encounter: Payer: Self-pay | Admitting: Internal Medicine

## 2023-01-16 ENCOUNTER — Other Ambulatory Visit: Payer: Self-pay | Admitting: Internal Medicine

## 2023-01-16 DIAGNOSIS — Z1211 Encounter for screening for malignant neoplasm of colon: Secondary | ICD-10-CM

## 2023-01-18 ENCOUNTER — Encounter: Payer: Self-pay | Admitting: Gastroenterology

## 2023-01-28 ENCOUNTER — Ambulatory Visit (INDEPENDENT_AMBULATORY_CARE_PROVIDER_SITE_OTHER): Payer: 59

## 2023-01-28 DIAGNOSIS — E538 Deficiency of other specified B group vitamins: Secondary | ICD-10-CM | POA: Diagnosis not present

## 2023-01-28 MED ORDER — CYANOCOBALAMIN 1000 MCG/ML IJ SOLN
1000.0000 ug | Freq: Once | INTRAMUSCULAR | Status: AC
  Administered 2023-01-28: 1000 ug via INTRAMUSCULAR

## 2023-01-28 NOTE — Progress Notes (Signed)
After obtaining consent, and per orders of Dr. Jones, injection of B12 given by Gamal Todisco P Jacinda Kanady. Patient instructed to report any adverse reaction to me immediately.  

## 2023-01-29 ENCOUNTER — Other Ambulatory Visit (HOSPITAL_COMMUNITY): Payer: Self-pay

## 2023-02-14 ENCOUNTER — Ambulatory Visit (AMBULATORY_SURGERY_CENTER): Payer: 59 | Admitting: *Deleted

## 2023-02-14 VITALS — Ht 66.0 in | Wt 150.0 lb

## 2023-02-14 DIAGNOSIS — Z1211 Encounter for screening for malignant neoplasm of colon: Secondary | ICD-10-CM

## 2023-02-14 MED ORDER — NA SULFATE-K SULFATE-MG SULF 17.5-3.13-1.6 GM/177ML PO SOLN: 1.0000 | Freq: Once | ORAL | 0 refills | Status: AC

## 2023-02-14 NOTE — Progress Notes (Signed)
Pt's pre-visit is done over the phone and all paperwork (prep instructions) sent to patient. Pt's name and DOB verified at the beginning of the pre-visit. Pt denies any difficulty with ambulating.  No egg or soy allergy known to patient  No issues known to pt with past sedation with any surgeries or procedures Pt denies having issues being intubated Patient denies ever being intubated No FH of Malignant Hyperthermia Pt is not on diet pills Pt is not on home 02  Pt is not on blood thinners  Pt denies issues with constipation  Pt is not on dialysis Pt denies any upcoming cardiac testing Pt encouraged to use to use Singlecare or Goodrx to reduce cost  Patient's chart reviewed by Osvaldo Angst CNRA prior to pre-visit and patient appropriate for the [place].  Pre-visit completed and red dot placed by patient's name on their procedure day (on provider's schedule).  . Visit by phone Pt states weight is  Instructions reviewed with pt and pt states understanding. Instructed to review again prior to procedure. Pt states they will.  Instructions sent by mail with coupon and by my chart

## 2023-02-28 ENCOUNTER — Ambulatory Visit (INDEPENDENT_AMBULATORY_CARE_PROVIDER_SITE_OTHER): Payer: 59

## 2023-02-28 DIAGNOSIS — E538 Deficiency of other specified B group vitamins: Secondary | ICD-10-CM

## 2023-02-28 MED ORDER — CYANOCOBALAMIN 1000 MCG/ML IJ SOLN
1000.0000 ug | Freq: Once | INTRAMUSCULAR | Status: AC
  Administered 2023-02-28: 1000 ug via INTRAMUSCULAR

## 2023-02-28 NOTE — Progress Notes (Signed)
Pt here for monthly B12 injection per   B12 1000mcg given IM and pt tolerated injection well.  Patient responded well to vaccine. 

## 2023-03-01 ENCOUNTER — Ambulatory Visit: Payer: 59

## 2023-03-06 ENCOUNTER — Other Ambulatory Visit: Payer: 59

## 2023-03-10 ENCOUNTER — Encounter: Payer: Self-pay | Admitting: Gastroenterology

## 2023-03-12 ENCOUNTER — Encounter: Payer: 59 | Admitting: Gastroenterology

## 2023-03-18 ENCOUNTER — Ambulatory Visit (AMBULATORY_SURGERY_CENTER): Payer: 59 | Admitting: Gastroenterology

## 2023-03-18 ENCOUNTER — Encounter: Payer: Self-pay | Admitting: Gastroenterology

## 2023-03-18 VITALS — BP 103/58 | HR 54 | Temp 97.8°F | Resp 14 | Ht 66.0 in | Wt 150.0 lb

## 2023-03-18 DIAGNOSIS — D124 Benign neoplasm of descending colon: Secondary | ICD-10-CM

## 2023-03-18 DIAGNOSIS — Z1211 Encounter for screening for malignant neoplasm of colon: Secondary | ICD-10-CM

## 2023-03-18 DIAGNOSIS — D122 Benign neoplasm of ascending colon: Secondary | ICD-10-CM | POA: Diagnosis not present

## 2023-03-18 LAB — HM COLONOSCOPY

## 2023-03-18 MED ORDER — SODIUM CHLORIDE 0.9 % IV SOLN
500.0000 mL | Freq: Once | INTRAVENOUS | Status: DC
Start: 2023-03-18 — End: 2023-03-18

## 2023-03-18 NOTE — Op Note (Signed)
[facility] Patient Name: Collin Horne Procedure Date: 03/18/2023 11:00 AM MRN: [id number] Endoscopist: Sherilyn Cooter L. Myrtie Neither , MD, [id number] Age: 57 Referring MD:  Date of Birth: [birth year] Gender: Male Account #: [id number] Procedure:                Colonoscopy Indications:              Screening for colorectal malignant neoplasm, This                            is the patient's first colonoscopy Medicines:                Monitored Anesthesia Care Procedure:                Pre-Anesthesia Assessment:                           - Prior to the procedure, a History and Physical                            was performed, and patient medications and                            allergies were reviewed. The patient's tolerance of                            previous anesthesia was also reviewed. The risks                            and benefits of the procedure and the sedation                            options and risks were discussed with the patient.                            All questions were answered, and informed consent                            was obtained. Prior Anticoagulants: The patient has                            taken no anticoagulant or antiplatelet agents. ASA                            Grade Assessment: II - A patient with mild systemic                            disease. After reviewing the risks and benefits,                            the patient was deemed in satisfactory condition to                            undergo the procedure.  After obtaining informed consent, the colonoscope                            was passed under direct vision. Throughout the                            procedure, the patient's blood pressure, pulse, and                            oxygen saturations were monitored continuously. The                            CF [id number] #[id number] was introduced through the anus                            and advanced to the  the terminal ileum, with                            identification of the appendiceal orifice and IC                            valve. The colonoscopy was performed without                            difficulty. The patient tolerated the procedure                            well. The quality of the bowel preparation was                            good. The terminal ileum, ileocecal valve,                            appendiceal orifice, and rectum were photographed. Scope In: 11:15:16 AM Scope Out: 11:32:47 AM Scope Withdrawal Time: 0 hours 15 minutes 4 seconds  Total Procedure Duration: 0 hours 17 minutes 31 seconds  Findings:                 The perianal and digital rectal examinations were                            normal.                           Three sessile polyps were found in the descending                            colon and ascending colon. The polyps were                            diminutive in size. These polyps were removed with                            a cold snare. Resection and retrieval were complete.  Repeat examination of right colon under NBI                            performed.                           Internal hemorrhoids were found.                           The exam was otherwise without abnormality on                            direct and retroflexion views. Complications:            No immediate complications. Estimated Blood Loss:     Estimated blood loss was minimal. Impression:               - Three diminutive polyps in the descending colon                            and in the ascending colon, removed with a cold                            snare. Resected and retrieved.                           - Internal hemorrhoids.                           - The examination was otherwise normal on direct                            and retroflexion views. Recommendation:           - Patient has a contact number available for                             emergencies. The signs and symptoms of potential                            delayed complications were discussed with the                            patient. Return to normal activities tomorrow.                            Written discharge instructions were provided to the                            patient.                           - Resume previous diet.                           - Continue present medications.                           -  Await pathology results.                           - Repeat colonoscopy is recommended for                            surveillance. The colonoscopy date will be                            determined after pathology results from today's                            exam become available for review. Destyn Parfitt L. Myrtie Neither, MD 03/18/2023 11:37:20 AM This report has been signed electronically.

## 2023-03-18 NOTE — Progress Notes (Signed)
History and Physical:  This patient presents for endoscopic testing for: Encounter Diagnosis  Name Primary?   Special screening for malignant neoplasms, colon Yes    Average risk for colorectal cancer.  First screening colonoscopy. Patient denies chronic abdominal pain, rectal bleeding, constipation or diarrhea.    Patient is otherwise without complaints or active issues today.   Past Medical History: Past Medical History:  Diagnosis Date   Anemia    Anxiety    Depression    Hyperlipidemia    Hypertension    Substance abuse (HCC)      Past Surgical History: Past Surgical History:  Procedure Laterality Date   ANKLE SURGERY  11/12/2005   COLONOSCOPY     SIGMOIDOSCOPY      Allergies: No Known Allergies  Outpatient Meds: Current Outpatient Medications  Medication Sig Dispense Refill   atorvastatin (LIPITOR) 20 MG tablet Take 1 tablet (20 mg total) by mouth daily. 90 tablet 1   desvenlafaxine (PRISTIQ) 100 MG 24 hr tablet Take 100 mg by mouth daily.     gabapentin (NEURONTIN) 600 MG tablet Take 800 mg by mouth 3 (three) times daily before meals.     lamoTRIgine (LAMICTAL) 150 MG tablet Take 100 mg by mouth daily.     olmesartan (BENICAR) 20 MG tablet Take 1 tablet (20 mg total) by mouth daily. 90 tablet 0   propranolol (INDERAL) 10 MG tablet Take by mouth.     sildenafil (VIAGRA) 100 MG tablet Take 1 tablet (100 mg total) by mouth daily as needed for erectile dysfunction. 30 tablet 0   traZODone (DESYREL) 50 MG tablet Take by mouth.     ketoconazole (NIZORAL) 2 % shampoo Apply 1 Application topically 2 (two) times a week. (Patient not taking: Reported on 02/14/2023) 120 mL 1   magnesium 30 MG tablet Take 30 mg by mouth 2 (two) times daily.     Current Facility-Administered Medications  Medication Dose Route Frequency Provider Last Rate Last Admin   0.9 %  sodium chloride infusion  500 mL Intravenous Once Sherrilyn Rist, MD           ___________________________________________________________________ Objective   Exam:  BP (!) 143/82   Pulse 60   Temp 97.8 F (36.6 C)   Ht 5\' 6"  (1.676 m)   Wt 150 lb (68 kg)   SpO2 95%   BMI 24.21 kg/m   CV: regular , S1/S2 Resp: clear to auscultation bilaterally, normal RR and effort noted GI: soft, no tenderness, with active bowel sounds.   Assessment: Encounter Diagnosis  Name Primary?   Special screening for malignant neoplasms, colon Yes     Plan: Colonoscopy  The benefits and risks of the planned procedure were described in detail with the patient or (when appropriate) their health care proxy.  Risks were outlined as including, but not limited to, bleeding, infection, perforation, adverse medication reaction leading to cardiac or pulmonary decompensation, pancreatitis (if ERCP).  The limitation of incomplete mucosal visualization was also discussed.  No guarantees or warranties were given.    The patient is appropriate for an endoscopic procedure in the ambulatory setting.   - Amada Jupiter, MD

## 2023-03-18 NOTE — Progress Notes (Signed)
Called to room to assist during endoscopic procedure.  Patient ID and intended procedure confirmed with present staff. Received instructions for my participation in the procedure from the performing physician.  

## 2023-03-18 NOTE — Progress Notes (Signed)
Pt's states no medical or surgical changes since previsit or office visit. 

## 2023-03-18 NOTE — Patient Instructions (Signed)
Discharge instructions given. Handouts on polyps and Hemorrhoids. Resume previous medications. YOU HAD AN ENDOSCOPIC PROCEDURE TODAY AT THE [facility]:   Refer to the procedure report that was given to you for any specific questions about what was found during the examination.  If the procedure report does not answer your questions, please call your gastroenterologist to clarify.  If you requested that your care partner not be given the details of your procedure findings, then the procedure report has been included in a sealed envelope for you to review at your convenience later.  YOU SHOULD EXPECT: Some feelings of bloating in the abdomen. Passage of more gas than usual.  Walking can help get rid of the air that was put into your GI tract during the procedure and reduce the bloating. If you had a lower endoscopy (such as a colonoscopy or flexible sigmoidoscopy) you may notice spotting of blood in your stool or on the toilet paper. If you underwent a bowel prep for your procedure, you may not have a normal bowel movement for a few days.  Please Note:  You might notice some irritation and congestion in your nose or some drainage.  This is from the oxygen used during your procedure.  There is no need for concern and it should clear up in a day or so.  SYMPTOMS TO REPORT IMMEDIATELY:  Following lower endoscopy (colonoscopy or flexible sigmoidoscopy):  Excessive amounts of blood in the stool  Significant tenderness or worsening of abdominal pains  Swelling of the abdomen that is new, acute  Fever of 100F or higher  For urgent or emergent issues, a gastroenterologist can be reached at any hour by calling [phone]. Do not use MyChart messaging for urgent concerns.    DIET:  We do recommend a small meal at first, but then you may proceed to your regular diet.  Drink plenty of fluids but you should avoid alcoholic beverages for 24 hours.  ACTIVITY:  You should plan to take it easy  for the rest of today and you should NOT DRIVE or use heavy machinery until tomorrow (because of the sedation medicines used during the test).    FOLLOW UP: Our staff will call the number listed on your records the next business day following your procedure.  We will call around 7:15- 8:00 am to check on you and address any questions or concerns that you may have regarding the information given to you following your procedure. If we do not reach you, we will leave a message.     If any biopsies were taken you will be contacted by phone or by letter within the next 1-3 weeks.  Please call us at [phone] if you have not heard about the biopsies in 3 weeks.    SIGNATURES/CONFIDENTIALITY: You and/or your care partner have signed paperwork which will be entered into your electronic medical record.  These signatures attest to the fact that that the information above on your After Visit Summary has been reviewed and is understood.  Full responsibility of the confidentiality of this discharge information lies with you and/or your care-partner. 

## 2023-03-18 NOTE — Progress Notes (Signed)
Pt resting comfortably. VSS. Airway intact. SBAR complete to RN. All questions answered.   

## 2023-03-19 ENCOUNTER — Telehealth: Payer: Self-pay | Admitting: *Deleted

## 2023-03-19 NOTE — Telephone Encounter (Signed)
  Follow up Call-     03/18/2023   10:54 AM  Call back number  Post procedure Call Back phone  # [phone]  Permission to leave phone message Yes     Patient questions:  Do you have a fever, pain , or abdominal swelling? No. Pain Score  0 *  Have you tolerated food without any problems? Yes.    Have you been able to return to your normal activities? Yes.    Do you have any questions about your discharge instructions: Diet   No. Medications  No. Follow up visit  No.  Do you have questions or concerns about your Care? No.  Actions: * If pain score is 4 or above: No action needed, pain <4.

## 2023-03-20 ENCOUNTER — Ambulatory Visit (HOSPITAL_COMMUNITY)
Admission: RE | Admit: 2023-03-20 | Discharge: 2023-03-20 | Disposition: A | Discharge status: Home / Self Care | Payer: 59 | Source: Ambulatory Visit | Attending: Acute Care | Admitting: Acute Care

## 2023-03-20 DIAGNOSIS — J439 Emphysema, unspecified: Secondary | ICD-10-CM | POA: Insufficient documentation

## 2023-03-20 DIAGNOSIS — I700 Atherosclerosis of aorta: Secondary | ICD-10-CM | POA: Diagnosis not present

## 2023-03-20 DIAGNOSIS — Z122 Encounter for screening for malignant neoplasm of respiratory organs: Secondary | ICD-10-CM | POA: Insufficient documentation

## 2023-03-20 DIAGNOSIS — Z87891 Personal history of nicotine dependence: Secondary | ICD-10-CM | POA: Insufficient documentation

## 2023-03-20 DIAGNOSIS — I2510 Atherosclerotic heart disease of native coronary artery without angina pectoris: Secondary | ICD-10-CM | POA: Diagnosis not present

## 2023-03-21 ENCOUNTER — Encounter: Payer: Self-pay | Admitting: Gastroenterology

## 2023-03-23 ENCOUNTER — Other Ambulatory Visit: Payer: Self-pay | Admitting: Internal Medicine

## 2023-03-23 DIAGNOSIS — I10 Essential (primary) hypertension: Secondary | ICD-10-CM

## 2023-03-30 ENCOUNTER — Other Ambulatory Visit: Payer: Self-pay | Admitting: Internal Medicine

## 2023-03-30 DIAGNOSIS — E785 Hyperlipidemia, unspecified: Secondary | ICD-10-CM

## 2023-04-01 ENCOUNTER — Ambulatory Visit (INDEPENDENT_AMBULATORY_CARE_PROVIDER_SITE_OTHER): Payer: 59 | Admitting: Internal Medicine

## 2023-04-01 ENCOUNTER — Encounter: Payer: Self-pay | Admitting: Internal Medicine

## 2023-04-01 VITALS — BP 138/84 | HR 91 | Temp 98.6°F | Resp 16 | Ht 66.0 in | Wt 151.0 lb

## 2023-04-01 DIAGNOSIS — D539 Nutritional anemia, unspecified: Secondary | ICD-10-CM

## 2023-04-01 DIAGNOSIS — D510 Vitamin B12 deficiency anemia due to intrinsic factor deficiency: Secondary | ICD-10-CM | POA: Diagnosis not present

## 2023-04-01 DIAGNOSIS — D538 Other specified nutritional anemias: Secondary | ICD-10-CM

## 2023-04-01 DIAGNOSIS — I10 Essential (primary) hypertension: Secondary | ICD-10-CM | POA: Diagnosis not present

## 2023-04-01 DIAGNOSIS — E519 Thiamine deficiency, unspecified: Secondary | ICD-10-CM

## 2023-04-01 MED ORDER — CYANOCOBALAMIN 1000 MCG/ML IJ SOLN
1000.0000 ug | Freq: Once | INTRAMUSCULAR | Status: AC
Start: 2023-04-01 — End: 2023-04-01
  Administered 2023-04-01: 1000 ug via INTRAMUSCULAR

## 2023-04-01 NOTE — Progress Notes (Unsigned)
Subjective:  Patient ID: Collin Horne, male    DOB: 28-Jul-1966  Age: 57 y.o. MRN: [id number]  CC: Hypertension and Anemia   HPI Collin Horne presents for f/up ----  He stopped drinking alcohol and tells me his tremors have significantly improved.  He is active and denies chest pain, shortness of breath, diaphoresis, edema, dizziness, or lightheadedness.  Outpatient Medications Prior to Visit  Medication Sig Dispense Refill   atorvastatin (LIPITOR) 20 MG tablet TAKE 1 TABLET BY MOUTH EVERY DAY 90 tablet 1   desvenlafaxine (PRISTIQ) 100 MG 24 hr tablet Take 100 mg by mouth daily.     gabapentin (NEURONTIN) 600 MG tablet Take 800 mg by mouth 3 (three) times daily before meals.     ketoconazole (NIZORAL) 2 % shampoo Apply 1 Application topically 2 (two) times a week. 120 mL 1   lamoTRIgine (LAMICTAL) 150 MG tablet Take 100 mg by mouth daily.     magnesium 30 MG tablet Take 30 mg by mouth 2 (two) times daily.     olmesartan (BENICAR) 20 MG tablet TAKE 1 TABLET BY MOUTH EVERY DAY 90 tablet 0   propranolol (INDERAL) 10 MG tablet Take by mouth.     sildenafil (VIAGRA) 100 MG tablet Take 1 tablet (100 mg total) by mouth daily as needed for erectile dysfunction. 30 tablet 0   traZODone (DESYREL) 50 MG tablet Take by mouth.     No facility-administered medications prior to visit.    ROS Review of Systems  Constitutional: Negative.  Negative for diaphoresis and fatigue.  HENT: Negative.    Eyes: Negative.   Respiratory:  Negative for chest tightness, shortness of breath and wheezing.   Cardiovascular:  Negative for chest pain, palpitations and leg swelling.  Gastrointestinal: Negative.  Negative for abdominal pain, diarrhea, nausea and vomiting.  Endocrine: Negative.   Genitourinary: Negative.   Musculoskeletal: Negative.  Negative for arthralgias and myalgias.  Skin: Negative.   Neurological:  Positive for tremors. Negative for dizziness, light-headedness and numbness.   Hematological:  Negative for adenopathy. Does not bruise/bleed easily.  Psychiatric/Behavioral: Negative.      Objective:  BP 138/84 (BP Location: Left Arm, Patient Position: Sitting, Cuff Size: Large)   Pulse 91   Temp 98.6 F (37 C) (Oral)   Resp 16   Ht 5\' 6"  (1.676 m)   Wt 151 lb (68.5 kg)   SpO2 93%   BMI 24.37 kg/m   BP Readings from Last 3 Encounters:  04/01/23 138/84  03/18/23 (!) 103/58  12/24/22 (!) 166/94    Wt Readings from Last 3 Encounters:  04/01/23 151 lb (68.5 kg)  03/18/23 150 lb (68 kg)  02/14/23 150 lb (68 kg)    Physical Exam Vitals reviewed.  HENT:     Mouth/Throat:     Mouth: Mucous membranes are moist.  Eyes:     General: No scleral icterus.    Conjunctiva/sclera: Conjunctivae normal.  Cardiovascular:     Rate and Rhythm: Normal rate and regular rhythm.     Heart sounds: No murmur heard. Pulmonary:     Effort: Pulmonary effort is normal.     Breath sounds: No stridor. No wheezing, rhonchi or rales.  Abdominal:     General: Abdomen is flat.     Palpations: Abdomen is soft. There is no mass.     Tenderness: There is no abdominal tenderness. There is no guarding.     Hernia: No hernia is present.  Musculoskeletal:  General: Normal range of motion.     Cervical back: Neck supple.     Right lower leg: No edema.     Left lower leg: No edema.  Skin:    General: Skin is warm and dry.     Coloration: Skin is not pale.  Neurological:     General: No focal deficit present.     Mental Status: He is alert. Mental status is at baseline.  Psychiatric:        Mood and Affect: Mood normal.        Behavior: Behavior normal.     Lab Results  Component Value Date   WBC 5.6 04/04/2023   HGB 10.8 (L) 04/04/2023   HCT 31.5 (L) 04/04/2023   PLT 246.0 04/04/2023   GLUCOSE 83 12/24/2022   CHOL 189 12/24/2022   TRIG 89.0 12/24/2022   HDL 66.40 12/24/2022   LDLDIRECT 70.0 12/21/2021   LDLCALC 104 (H) 12/24/2022   ALT 15 10/24/2022   AST  18 10/24/2022   NA 138 12/24/2022   K 3.7 12/24/2022   CL 98 12/24/2022   CREATININE 1.06 12/24/2022   BUN 7 12/24/2022   CO2 29 12/24/2022   TSH 0.86 10/24/2022   PSA 0.74 10/24/2022    CT CHEST LUNG CA SCREEN LOW DOSE W/O CM  Result Date: 03/22/2023 CLINICAL DATA:  57 year old with 35 pack-year history EXAM: CT CHEST WITHOUT CONTRAST LOW-DOSE FOR LUNG CANCER SCREENING TECHNIQUE: Multidetector CT imaging of the chest was performed following the standard protocol without IV contrast. RADIATION DOSE REDUCTION: This exam was performed according to the departmental dose-optimization program which includes automated exposure control, adjustment of the mA and/or kV according to patient size and/or use of iterative reconstruction technique. COMPARISON:  Lung cancer screening CT dated March 06, 2022 FINDINGS: Cardiovascular: Normal heart size. No pericardial effusion. Normal caliber thoracic aorta with mild calcified plaque. Moderate coronary artery calcifications. Mediastinum/Nodes: Esophagus and thyroid are unremarkable. No enlarged lymph nodes seen in the chest. Lungs/Pleura: Central airways are patent. Mild centrilobular emphysema. No consolidation, pleural effusion or pneumothorax. Stable ground-glass nodule of the right lung apex measuring 2.8 mm on image 35. Upper Abdomen: No acute abnormality. Musculoskeletal: New mild compression deformities of T10 and T11. No aggressive appearing osseous lesions. IMPRESSION: 1. Lung-RADS 2, benign appearance or behavior. Continue annual screening with low-dose chest CT without contrast in 12 months. 2. Mild compression deformities of T10 and T11, new when compared with prior age indeterminate. Correlate for point tenderness. 3. Coronary artery calcifications, aortic Atherosclerosis (ICD10-I70.0) and Emphysema (ICD10-J43.9). Electronically Signed   By: Allegra Lai M.D.   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  Will treat the B12 and B1 deficiencies. -     Vitamin B1; Future  Primary hypertension- His blood pressure is adequately well-controlled.  Anemia due to acquired thiamine deficiency -     Vitamin B-1; Take 1 tablet (50 mg total) by mouth daily.  Dispense: 90 tablet; Refill: 1     Follow-up: Return in about 4 months (around 08/02/2023).  Sanda Linger, MD

## 2023-04-01 NOTE — Patient Instructions (Signed)

## 2023-04-04 ENCOUNTER — Ambulatory Visit: Payer: 59 | Admitting: Internal Medicine

## 2023-04-04 ENCOUNTER — Ambulatory Visit: Payer: 59

## 2023-04-04 LAB — CBC WITH DIFFERENTIAL/PLATELET
Basophils Absolute: 0 10*3/uL (ref 0.0–0.1)
Basophils Relative: 0.7 % (ref 0.0–3.0)
Eosinophils Absolute: 0.1 10*3/uL (ref 0.0–0.7)
Eosinophils Relative: 2.5 % (ref 0.0–5.0)
HCT: 31.5 % — ABNORMAL LOW (ref 39.0–52.0)
Hemoglobin: 10.8 g/dL — ABNORMAL LOW (ref 13.0–17.0)
Lymphocytes Relative: 41.7 % (ref 12.0–46.0)
Lymphs Abs: 2.3 10*3/uL (ref 0.7–4.0)
MCHC: 34.3 g/dL (ref 30.0–36.0)
MCV: 91.6 fl (ref 78.0–100.0)
Monocytes Absolute: 0.4 10*3/uL (ref 0.1–1.0)
Monocytes Relative: 7.8 % (ref 3.0–12.0)
Neutro Abs: 2.7 10*3/uL (ref 1.4–7.7)
Neutrophils Relative %: 47.3 % (ref 43.0–77.0)
Platelets: 246 10*3/uL (ref 150.0–400.0)
RBC: 3.44 Mil/uL — ABNORMAL LOW (ref 4.22–5.81)
RDW: 13.6 % (ref 11.5–15.5)
WBC: 5.6 10*3/uL (ref 4.0–10.5)

## 2023-04-08 DIAGNOSIS — E519 Thiamine deficiency, unspecified: Secondary | ICD-10-CM | POA: Insufficient documentation

## 2023-04-08 LAB — VITAMIN B1: Vitamin B1 (Thiamine): 9 nmol/L (ref 8–30)

## 2023-04-08 MED ORDER — VITAMIN B-1 50 MG PO TABS
50.0000 mg | ORAL_TABLET | Freq: Every day | ORAL | 1 refills | Status: DC
Start: 2023-04-08 — End: 2023-10-21

## 2023-04-10 IMAGING — CT CT CHEST LUNG CANCER SCREENING LOW DOSE W/O CM
2 of 4 series · 15 of 36 positions shown, 18 images · non-contrast
Comparison: None.

CLINICAL DATA: 55-year-old male with 32 pack-year history of
smoking. Lung cancer screening.



[Series 3: lung thins 1.0 · axial · 0.64mm/px · z∈[-310,-41]mm · 12 of 297 slices shown, 15 images]
[im 14/297  mediastinal]
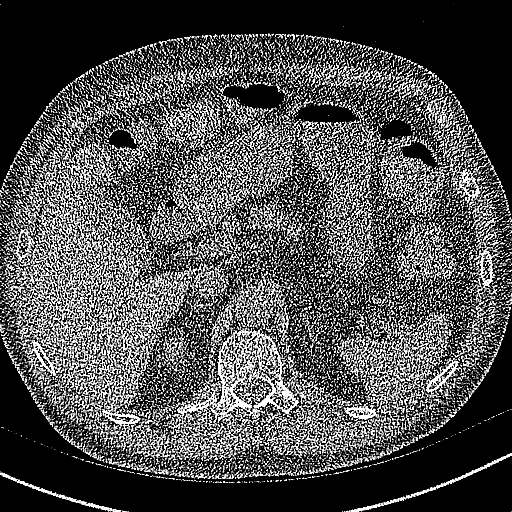
[im 14/297  lung]
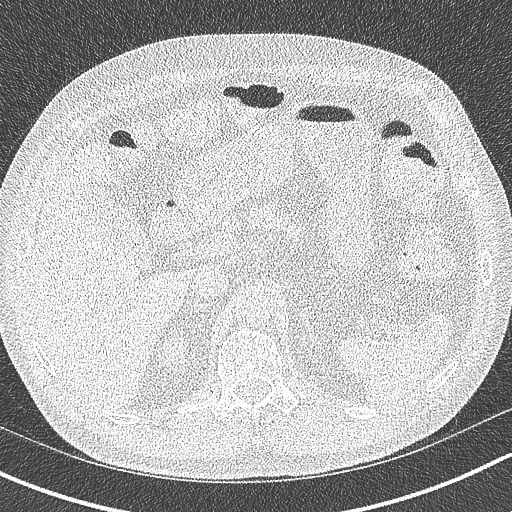
[im 41/297  lung]
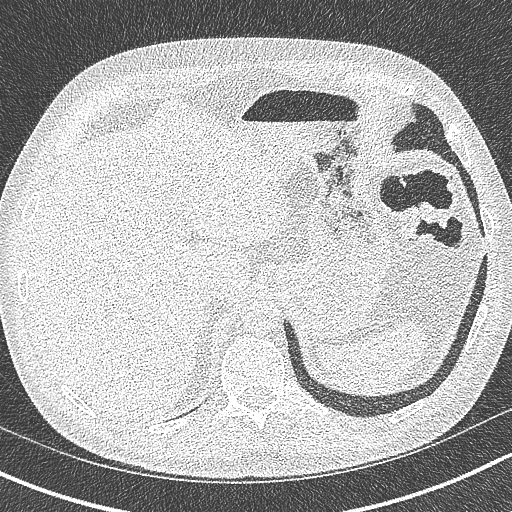
[im 68/297  lung]
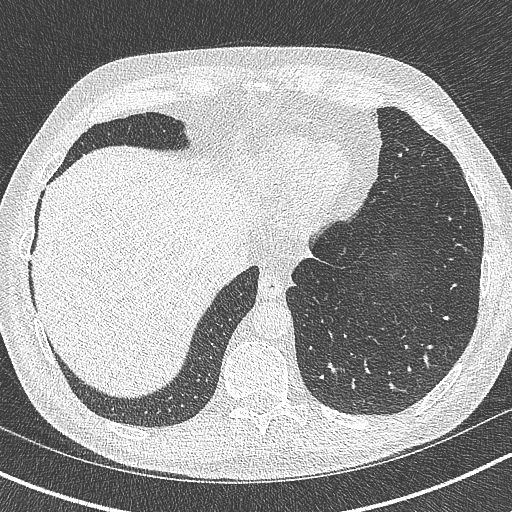
[im 95/297  lung]
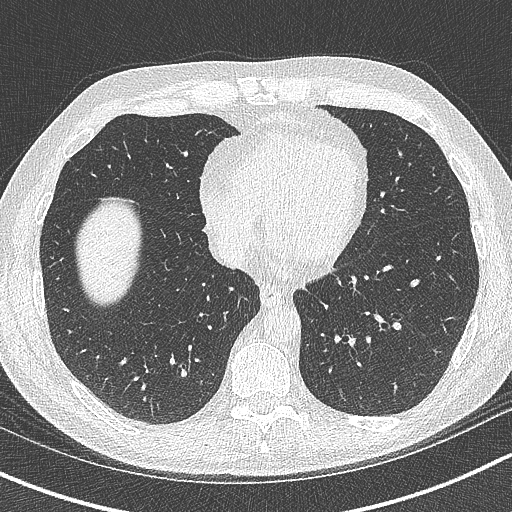
[im 108/297  mediastinal]
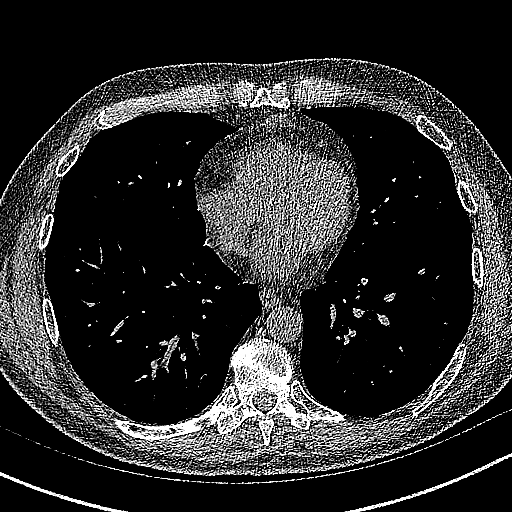
[im 108/297  lung]
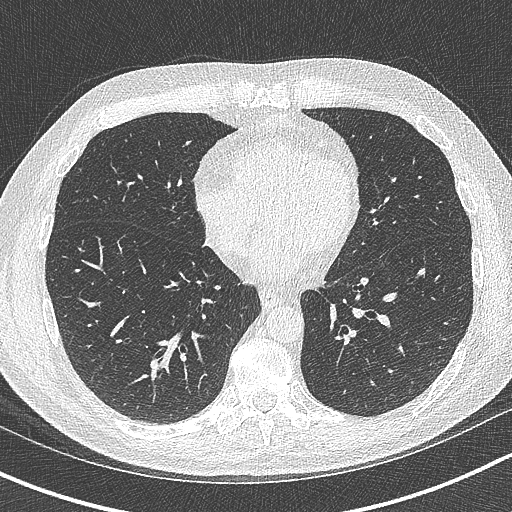
[im 135/297  lung]
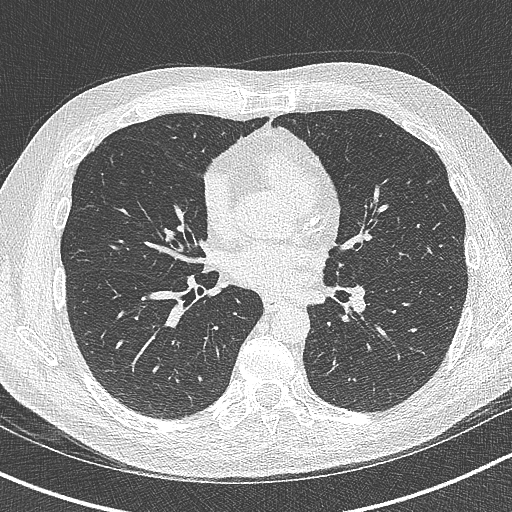
[im 162/297  lung]
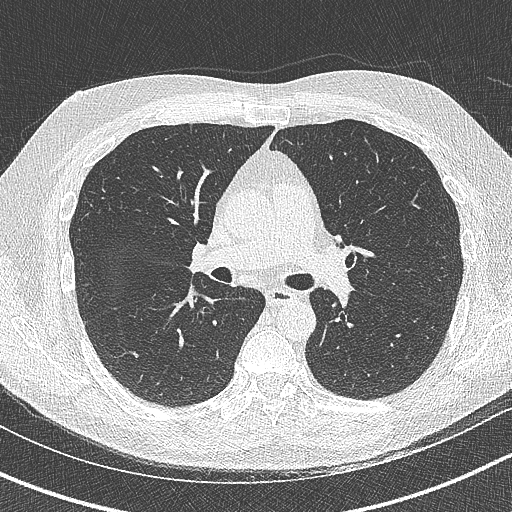
[im 189/297  lung]
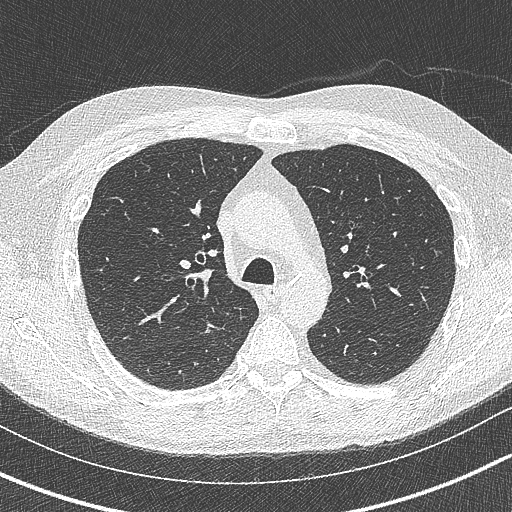
[im 202/297  mediastinal]
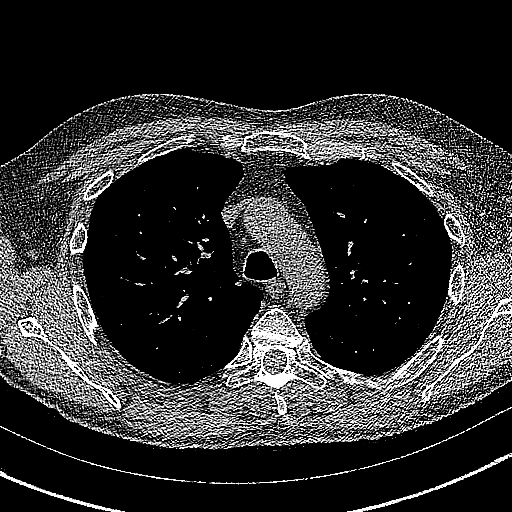
[im 202/297  lung]
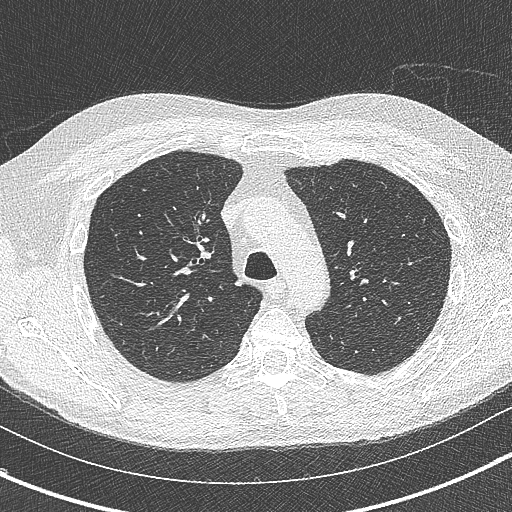
[im 229/297  lung]
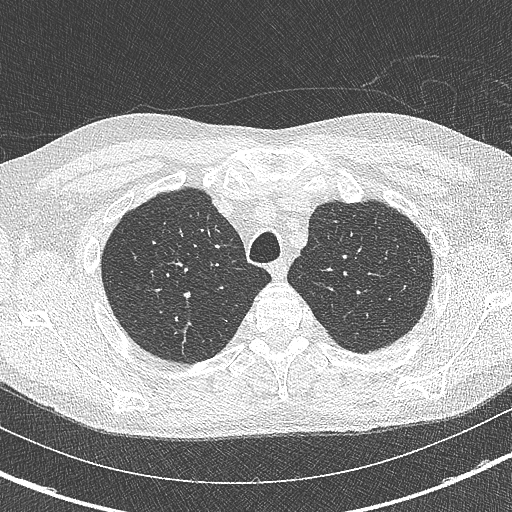
[im 256/297  lung]
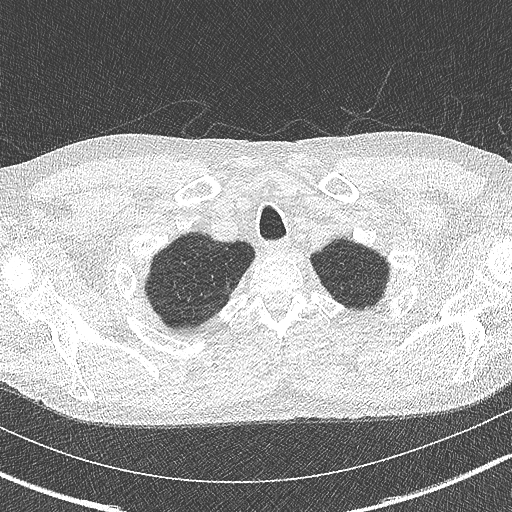
[im 283/297  lung]
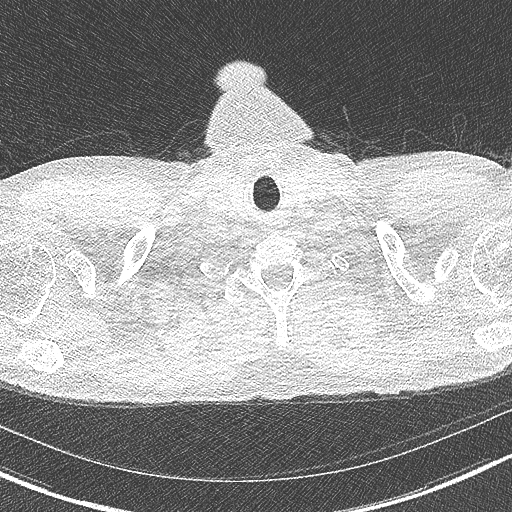

[Series 5: coronal · coronal · 0.65mm/px · 3 of 144 slices shown]
[im 29/144  lung]
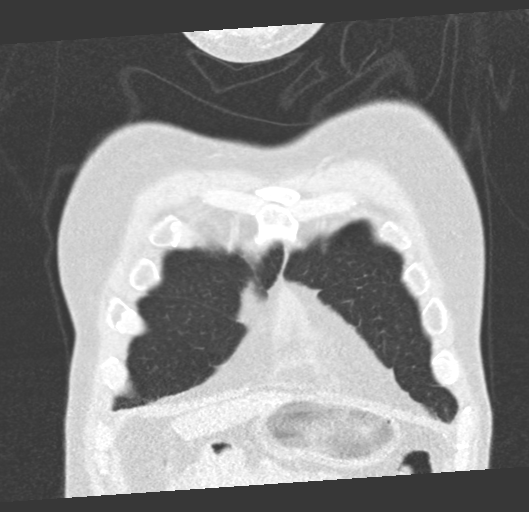
[im 58/144  lung]
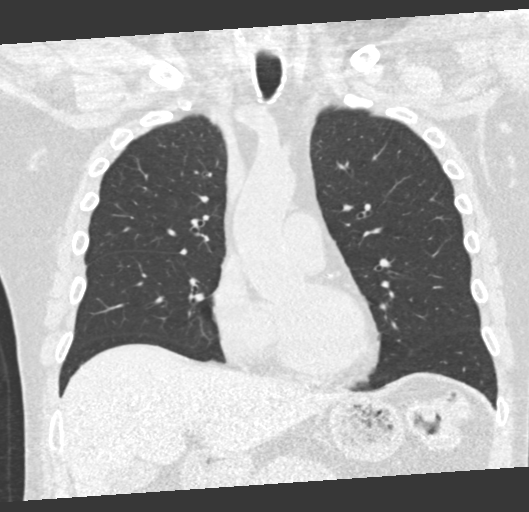
[im 86/144  lung]
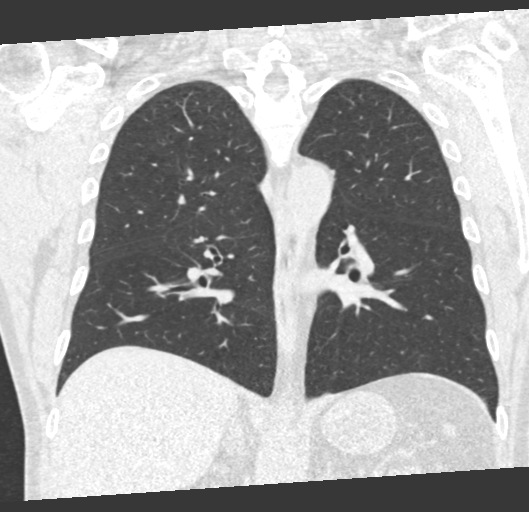

[15 of 36 positions shown; findings below may reference images not displayed]

FINDINGS: Cardiovascular: The heart size is normal. No substantial pericardial
effusion. Coronary artery calcification is evident. Mild
atherosclerotic calcification is noted in the wall of the thoracic
aorta.

Mediastinum/Nodes: No mediastinal lymphadenopathy. No evidence for
gross hilar lymphadenopathy although assessment is limited by the
lack of intravenous contrast on the current study. The esophagus has
normal imaging features. There is no axillary lymphadenopathy.

Lungs/Pleura: Subtle changes of centrilobular emphysema noted in the
lung apices. Calcified granuloma noted left upper lobe. No
suspicious pulmonary nodule or mass. No focal airspace
consolidation. No pleural effusion.

Upper Abdomen: Unremarkable.

Musculoskeletal: No worrisome lytic or sclerotic osseous
abnormality.
IMPRESSION: 1. Lung-RADS 1, negative. Continue annual screening with low-dose
chest CT without contrast in 12 months.
2. Aortic Atherosclerosis ([id number].C) and Emphysema ([id number]).

## 2023-04-12 ENCOUNTER — Encounter: Payer: Self-pay | Admitting: Internal Medicine

## 2023-05-03 ENCOUNTER — Ambulatory Visit (INDEPENDENT_AMBULATORY_CARE_PROVIDER_SITE_OTHER): Payer: 59

## 2023-05-03 DIAGNOSIS — E538 Deficiency of other specified B group vitamins: Secondary | ICD-10-CM

## 2023-05-03 MED ORDER — CYANOCOBALAMIN 1000 MCG/ML IJ SOLN
1000.0000 ug | Freq: Once | INTRAMUSCULAR | Status: AC
Start: 2023-05-03 — End: 2023-05-03
  Administered 2023-05-03: 1000 ug via INTRAMUSCULAR

## 2023-05-03 NOTE — Progress Notes (Signed)
B12 given.  Pt tolerated well. Pt is aware to give the office a call for an side effects or reactions. Please co-sign.   

## 2023-06-06 ENCOUNTER — Other Ambulatory Visit: Payer: Self-pay | Admitting: Internal Medicine

## 2023-06-06 DIAGNOSIS — I10 Essential (primary) hypertension: Secondary | ICD-10-CM

## 2023-06-07 ENCOUNTER — Ambulatory Visit: Payer: 59

## 2023-06-07 DIAGNOSIS — D510 Vitamin B12 deficiency anemia due to intrinsic factor deficiency: Secondary | ICD-10-CM | POA: Diagnosis not present

## 2023-06-07 MED ORDER — CYANOCOBALAMIN 1000 MCG/ML IJ SOLN
1000.0000 ug | Freq: Once | INTRAMUSCULAR | Status: AC
Start: 2023-06-07 — End: 2023-06-07
  Administered 2023-06-07: 1000 ug via INTRAMUSCULAR

## 2023-06-07 NOTE — Progress Notes (Signed)
Patient here for monthly B12 injection per Dr. Jones.  B12 1000 mcg given in left IM and patient tolerated injection well today.  

## 2023-06-12 ENCOUNTER — Encounter (INDEPENDENT_AMBULATORY_CARE_PROVIDER_SITE_OTHER): Payer: Self-pay

## 2023-07-08 ENCOUNTER — Ambulatory Visit: Payer: 59

## 2023-07-12 ENCOUNTER — Ambulatory Visit (INDEPENDENT_AMBULATORY_CARE_PROVIDER_SITE_OTHER): Payer: 59

## 2023-07-12 DIAGNOSIS — E538 Deficiency of other specified B group vitamins: Secondary | ICD-10-CM | POA: Diagnosis not present

## 2023-07-12 MED ORDER — CYANOCOBALAMIN 1000 MCG/ML IJ SOLN
1000.0000 ug | Freq: Once | INTRAMUSCULAR | Status: AC
Start: 2023-07-12 — End: 2023-07-12
  Administered 2023-07-12: 1000 ug via INTRAMUSCULAR

## 2023-07-12 NOTE — Progress Notes (Signed)
B12 given.  Pt tolerated well. Pt is aware to give the office a call for an side effects or reactions. Please co-sign.   

## 2023-08-12 ENCOUNTER — Ambulatory Visit: Payer: 59

## 2023-08-22 ENCOUNTER — Ambulatory Visit (INDEPENDENT_AMBULATORY_CARE_PROVIDER_SITE_OTHER): Payer: 59 | Admitting: Radiology

## 2023-08-22 ENCOUNTER — Ambulatory Visit: Payer: 59

## 2023-08-22 DIAGNOSIS — E538 Deficiency of other specified B group vitamins: Secondary | ICD-10-CM | POA: Diagnosis not present

## 2023-08-22 MED ORDER — CYANOCOBALAMIN 1000 MCG/ML IJ SOLN
1000.0000 ug | Freq: Once | INTRAMUSCULAR | Status: AC
Start: 2023-08-22 — End: 2023-08-22
  Administered 2023-08-22: 1000 ug via INTRAMUSCULAR

## 2023-08-22 NOTE — Progress Notes (Signed)
Patient here for b!2 injection. Patient tolerated well with no complications.

## 2023-08-23 ENCOUNTER — Encounter (HOSPITAL_COMMUNITY): Payer: Self-pay | Admitting: Emergency Medicine

## 2023-08-23 ENCOUNTER — Emergency Department (HOSPITAL_COMMUNITY): Payer: 59

## 2023-08-23 ENCOUNTER — Emergency Department (HOSPITAL_COMMUNITY)
Admission: EM | Admit: 2023-08-23 | Discharge: 2023-08-23 | Disposition: A | Discharge status: Home / Self Care | Payer: 59 | Attending: Emergency Medicine | Admitting: Emergency Medicine

## 2023-08-23 ENCOUNTER — Other Ambulatory Visit: Payer: Self-pay

## 2023-08-23 DIAGNOSIS — S82892A Other fracture of left lower leg, initial encounter for closed fracture: Secondary | ICD-10-CM | POA: Insufficient documentation

## 2023-08-23 DIAGNOSIS — I10 Essential (primary) hypertension: Secondary | ICD-10-CM | POA: Insufficient documentation

## 2023-08-23 DIAGNOSIS — M25572 Pain in left ankle and joints of left foot: Secondary | ICD-10-CM | POA: Diagnosis present

## 2023-08-23 DIAGNOSIS — X501XXA Overexertion from prolonged static or awkward postures, initial encounter: Secondary | ICD-10-CM | POA: Diagnosis not present

## 2023-08-23 DIAGNOSIS — W19XXXA Unspecified fall, initial encounter: Secondary | ICD-10-CM

## 2023-08-23 MED ORDER — LORAZEPAM 1 MG PO TABS: 1.0000 mg | ORAL_TABLET | Freq: Once | ORAL | Status: DC

## 2023-08-23 MED ORDER — NAPROXEN 500 MG PO TABS: 500.0000 mg | ORAL_TABLET | Freq: Two times a day (BID) | ORAL | 0 refills | Status: AC

## 2023-08-23 MED ORDER — ETOMIDATE 2 MG/ML IV SOLN
10.0000 mg | Freq: Once | INTRAVENOUS | Status: AC
  Administered 2023-08-23: 10 mg via INTRAVENOUS
  Filled 2023-08-23: qty 10

## 2023-08-23 MED ORDER — OXYCODONE-ACETAMINOPHEN 5-325 MG PO TABS
1.0000 | ORAL_TABLET | Freq: Four times a day (QID) | ORAL | 0 refills | Status: DC | PRN
Start: 2023-08-23 — End: 2023-10-23

## 2023-08-23 MED ORDER — SENNOSIDES-DOCUSATE SODIUM 8.6-50 MG PO TABS: 1.0000 | ORAL_TABLET | Freq: Every evening | ORAL | 0 refills | Status: DC | PRN

## 2023-08-23 MED ORDER — METHOCARBAMOL 500 MG PO TABS: 500.0000 mg | ORAL_TABLET | Freq: Three times a day (TID) | ORAL | 0 refills | Status: DC | PRN

## 2023-08-23 MED ORDER — MORPHINE SULFATE (PF) 4 MG/ML IV SOLN
4.0000 mg | Freq: Once | INTRAVENOUS | Status: AC
  Administered 2023-08-23: 4 mg via INTRAVENOUS
  Filled 2023-08-23: qty 1

## 2023-08-23 MED ORDER — HYDROCODONE-ACETAMINOPHEN 5-325 MG PO TABS
1.0000 | ORAL_TABLET | Freq: Once | ORAL | Status: AC
  Administered 2023-08-23: 1 via ORAL
  Filled 2023-08-23: qty 1

## 2023-08-23 MED ORDER — IBUPROFEN 200 MG PO TABS
400.0000 mg | ORAL_TABLET | Freq: Once | ORAL | Status: AC | PRN
  Administered 2023-08-23: 400 mg via ORAL
  Filled 2023-08-23: qty 2

## 2023-08-23 MED ORDER — ONDANSETRON HCL 4 MG/2ML IJ SOLN
4.0000 mg | Freq: Once | INTRAMUSCULAR | Status: AC
  Administered 2023-08-23: 4 mg via INTRAVENOUS
  Filled 2023-08-23: qty 2

## 2023-08-23 NOTE — ED Notes (Signed)
Pt calm and cooperative. Pt using urinal for BR needs. Pt has no other needs at this time.

## 2023-08-23 NOTE — ED Notes (Signed)
Pt continues to yell from triage room 2. Pt refused XR. Pt informed he would need to comply to XR. EDP came to triage room 2. Pt then continued to state that we were refusing him care. Pt asked to comply with XR. RN Marisue Ivan, MD Rancour, and NT Lyric along with this RN at bedside. After departure of MD, pt then threw himself onto the floor. Reports being unable to get off the floor. This RN attempted to assist pt off of the floor. Pt continued to use loud violent language. Stating that we are refusing him care. Repeated to pt staff is trying to help him. Attempted to assist pt to a sitting position. Pt refused and swatted RN away. Unable to assist pt. Pt continues to yell at staff and make threats towards staff. Security and off duty officer to triage. Attempted continue to convince pt to get off of the floor. Pt refused care once again. Pt was assisted off of the floor with assistance form security. While transferring pt off of the floor pt was able to bear weight to onto right ankle to get up. While transferring pt onto wheelchair pt stopped assisting and threw himself onto the floor once again. With assistance of 5 staff members onto wheelchair. Pt sts he would comply with XR.

## 2023-08-23 NOTE — ED Notes (Signed)
Pt is agitated requested EDP to see pt

## 2023-08-23 NOTE — ED Notes (Signed)
Pt is now requesting to have his x-rays done.

## 2023-08-23 NOTE — ED Notes (Signed)
Pt raised their voice at staff and started yelling at the x-ray tech and refused scan, staff entered the room and attempted to calm the patient down the patient continued to yell and became more aggressive and security was called.

## 2023-08-23 NOTE — ED Notes (Signed)
Pt is yelling at staff about the wait times

## 2023-08-23 NOTE — Progress Notes (Signed)
RT NOTE:  RT present for conscious sedation, suction set up and ambu bag both at head of bed and ready to use. End-tidal Co2 placed on 3L for procedure. RT had to bag patient for 2 mins during sedation due to low saturations at 86%, all vital signs returned to normal after bagging and no other complications noted. Pt awake and alert before RT left the room.

## 2023-08-23 NOTE — ED Notes (Signed)
Pt taken to the lobby. There was a miscommunication and I thought [employer] was here and they were only on the phone. [employer] has a number to call on their arrival.

## 2023-08-23 NOTE — ED Notes (Signed)
[employer] called at this time.  

## 2023-08-23 NOTE — ED Notes (Signed)
Pt continues to refuse care after second attempt of XR was made. Pt wheelchair to lobby by charge RN Tj.

## 2023-08-23 NOTE — Progress Notes (Signed)
Orthopedic Tech Progress Note Patient Details:  Collin Horne [birth year] [id number] Applied posterior and stirrup splint per order. Provided pt with crutches.  Ortho Devices Type of Ortho Device: Stirrup splint, Short leg splint, Crutches Ortho Device/Splint Location: LLE Ortho Device/Splint Interventions: Ordered, Application, Adjustment   Post Interventions Patient Tolerated: Well Instructions Provided: Adjustment of device, Care of device  Blase Mess 08/23/2023, 1:22 PM

## 2023-08-23 NOTE — ED Triage Notes (Signed)
Pt BIB EMS for L ankle pain, pt reoprts slipping on grass approximately around 0100. EMS reports finding pt in the parking lot of his home. L ankle visibly swollen. No LOC. ETOH on board.

## 2023-08-23 NOTE — ED Notes (Signed)
X-ray tech came to room to take pt for his x-ray, when x-ray tech asked pt to move his foot over pt became agitated and started to use foul language towards staff. Pt refysed x-ray. This RN, the tech, and another RN attempted to redirect pt and de-escalate pt. Pt continued to use foul language towards staff. RN Minus Liberty notified MD. MD came to bedside to assess pt. When MD left bedside pt threw himself onto the floor from the recliner. This RN, techs, and RN Esbeyde attempted to get pt into chair but pt continued to be beligerent. Security arrived and assisted [place] with getting pt into wheelchair.

## 2023-08-23 NOTE — ED Notes (Signed)
Conscious Sedation in order to reduce a left ankle fracture was performed. Time out was called 1242 using 2 identifiers. Vitals taken. Per MD, 10 mg of Etomidate was administered. Due to dropping O2 sat, the patient was bagged by respiratory around 1247. Patient recovered well. Splint applied to the left ankle. Ortho to give crutches. Sedation ended 1254. Team involved: RN, RT, Ortho tech and MD.

## 2023-08-23 NOTE — Discharge Instructions (Addendum)
You were seen in the Emergency Department today with an ankle fracture.  We were able to line this up better and placed a splint but you will need to see the orthopedic doctor soon.  Please call the number provided to schedule a follow-up appointment.  You should keep all weight off of this foot/ankle until you have seen the orthopedist. Elevated the foot above the level of your heart when resting. Intermittently apply ice to help reduce swelling.   I have provided you several pain medications including Percocet.  This is a stronger pain medicine.  You cannot take this if you will be drinking alcohol or driving a car.  Robaxin, a muscle relaxer, can also cause drowsiness and you should not drive all taking this either.

## 2023-08-23 NOTE — ED Provider Notes (Signed)
Emergency Department Provider Note   I have reviewed the triage vital signs and the nursing notes.   HISTORY  Chief Complaint Ankle Pain   HPI Collin Horne is a 57 y.o. male past history of alcohol use, hypertension, hyperlipidemia presents emergency department left ankle pain.  Patient tells me he was drinking last night and slipped in the grass twisting his left ankle.  Since that time has had pain and swelling to the ankle.  He denies hitting his head.  No chest or abdominal pain.  No low back pain.  Denies knee or hip pain.   Past Medical History:  Diagnosis Date   Anemia    Anxiety    Depression    Hyperlipidemia    Hypertension    Substance abuse (HCC)     Review of Systems  Constitutional: No fever/chills Cardiovascular: Denies chest pain. Respiratory: Denies shortness of breath. Gastrointestinal: No abdominal pain.   Musculoskeletal: Negative for back pain. Positive left ankle pain and swelling.  Skin: Negative for rash. Neurological: Negative for headaches.  ____________________________________________   PHYSICAL EXAM:  VITAL SIGNS: ED Triage Vitals  Encounter Vitals Group     BP 08/23/23 0514 (!) 112/46     Pulse Rate 08/23/23 0514 97     Resp 08/23/23 0514 18     Temp 08/23/23 0514 98 F (36.7 C)     Temp Source 08/23/23 0514 Oral     SpO2 08/23/23 0514 100 %     Weight 08/23/23 0522 150 lb (68 kg)     Height 08/23/23 0522 5\' 6"  (1.676 m)   Constitutional: Alert and oriented. Well appearing and in no acute distress. Appears sober.  Eyes: Conjunctivae are normal.  Head: Atraumatic. Nose: No congestion/rhinnorhea. Mouth/Throat: Mucous membranes are moist.   Neck: No stridor. C spine non-tender to palpation.  Cardiovascular: Normal rate, regular rhythm. Good peripheral circulation. Grossly normal heart sounds.   Respiratory: Normal respiratory effort.  No retractions. Lungs CTAB. Gastrointestinal: Soft and nontender. No distention.   Musculoskeletal: Swelling to the left ankle with mild bruising.  Pulses and sensation intact.  Compartments soft in the left lower leg.  No proximal fibular tenderness.  Neurologic:  Normal speech and language. No gross focal neurologic deficits are appreciated.  Skin:  Skin is warm, dry and intact. No rash noted.  ____________________________________________  RADIOLOGY  DG Ankle 2 Views Left  Result Date: 08/23/2023 CLINICAL DATA:  Post reduction ankle fracture. EXAM: LEFT ANKLE - 2 VIEW COMPARISON:  Radiographs earlier the same date. FINDINGS: 1314 hours. The ankle has been splinted. Interval near anatomic reduction of the previously demonstrated trimalleolar fractures. There is no significant residual lateral subluxation of the talus relative to the tibial plafond. IMPRESSION: Near anatomic alignment of the trimalleolar fracture post closed reduction. Electronically Signed   By: Carey Bullocks M.D.   On: 08/23/2023 15:12   DG Foot Complete Left  Result Date: 08/23/2023 CLINICAL DATA:  Pain after injury. EXAM: LEFT ANKLE COMPLETE - 3+ VIEW; LEFT FOOT - COMPLETE 3+ VIEW COMPARISON:  None Available. FINDINGS: Ankle: Displaced and angulated distal fibular shaft fracture, proximal to the ankle mortise. Transverse medial malleolar fracture at the level of the ankle mortise. Suspect small posterior malleolar fracture. There is lateral translation of distal fracture fragments in the talus with respect to the tibial plafond. Widening of the anterior mortise. Generalized soft tissue edema. Foot: No additional fracture of the foot. The alignment and joint spaces are normal. Mild soft tissue edema. There  are vascular calcifications. IMPRESSION: 1. Trimalleolar ankle fracture with lateral subluxation of distal fracture fragments. 2. No additional fracture of the foot. Electronically Signed   By: Narda Rutherford M.D.   On: 08/23/2023 12:41   DG Ankle Complete Left  Result Date: 08/23/2023 CLINICAL  DATA:  Pain after injury. EXAM: LEFT ANKLE COMPLETE - 3+ VIEW; LEFT FOOT - COMPLETE 3+ VIEW COMPARISON:  None Available. FINDINGS: Ankle: Displaced and angulated distal fibular shaft fracture, proximal to the ankle mortise. Transverse medial malleolar fracture at the level of the ankle mortise. Suspect small posterior malleolar fracture. There is lateral translation of distal fracture fragments in the talus with respect to the tibial plafond. Widening of the anterior mortise. Generalized soft tissue edema. Foot: No additional fracture of the foot. The alignment and joint spaces are normal. Mild soft tissue edema. There are vascular calcifications. IMPRESSION: 1. Trimalleolar ankle fracture with lateral subluxation of distal fracture fragments. 2. No additional fracture of the foot. Electronically Signed   By: Narda Rutherford M.D.   On: 08/23/2023 12:41    ____________________________________________   PROCEDURES  Procedure(s) performed:   Reduction of fracture  Date/Time: 08/23/2023 1:30 PM  Performed by: Maia Plan, MD Authorized by: Maia Plan, MD  Consent: Written consent obtained. Risks and benefits: risks, benefits and alternatives were discussed Consent given by: patient Required items: required blood products, implants, devices, and special equipment available Patient identity confirmed: verbally with patient and arm band Time out: Immediately prior to procedure a "time out" was called to verify the correct patient, procedure, equipment, support staff and site/side marked as required. Local anesthesia used: no  Anesthesia: Local anesthesia used: no  Sedation: Patient sedated: yes Sedation type: (deep) Sedatives: etomidate Analgesia: morphine Vitals: Vital signs were monitored during sedation.  Patient tolerance: patient tolerated the procedure well with no immediate complications Comments: After adequate sedation the left ankle was reduced by hold foot in traction and  shifting ankle mortise medially. This was held in place while splint was applied by the orthopedic tech at bedside. No immediate complication.    .Sedation  Date/Time: 08/23/2023 1:31 PM  Performed by: Maia Plan, MD Authorized by: Maia Plan, MD   Consent:    Consent obtained:  Written   Consent given by:  Patient   Risks discussed:  Allergic reaction, dysrhythmia, inadequate sedation, nausea, prolonged hypoxia resulting in organ damage, prolonged sedation necessitating reversal, respiratory compromise necessitating ventilatory assistance and intubation and vomiting Universal protocol:    Immediately prior to procedure, a time out was called: yes     Patient identity confirmed:  Arm band and verbally with patient Indications:    Procedure performed:  Fracture reduction   Procedure necessitating sedation performed by:  Physician performing sedation Pre-sedation assessment:    Time since last food or drink:  10 hours   ASA classification: class 2 - patient with mild systemic disease     Mouth opening:  3 or more finger widths   Mallampati score:  II - soft palate, uvula, fauces visible   Neck mobility: normal     Pre-sedation assessments completed and reviewed: airway patency, cardiovascular function, hydration status, mental status, nausea/vomiting, pain level, respiratory function and temperature   Immediate pre-procedure details:    Reassessment: Patient reassessed immediately prior to procedure     Reviewed: vital signs, relevant labs/tests and NPO status     Verified: bag valve mask available, emergency equipment available, intubation equipment available, IV patency confirmed, oxygen available  and suction available   Procedure details (see MAR for exact dosages):    Preoxygenation:  Nasal cannula   Sedation:  Etomidate   Intended level of sedation: deep   Analgesia:  Morphine   Intra-procedure monitoring:  Blood pressure monitoring, continuous capnometry, continuous  pulse oximetry, frequent LOC assessments, frequent vital sign checks and cardiac monitor   Intra-procedure events: hypoxia     Intra-procedure events comment:  Mild hypoxemia requiring increased supplemental O2.   Intra-procedure management:  Airway repositioning   Total Provider sedation time (minutes):  30 Post-procedure details:    Attendance: Constant attendance by certified staff until patient recovered     Recovery: Patient returned to pre-procedure baseline     Post-sedation assessments completed and reviewed: airway patency, cardiovascular function, hydration status, mental status, nausea/vomiting, pain level, respiratory function and temperature     Patient is stable for discharge or admission: yes     Procedure completion:  Tolerated well, no immediate complications    ____________________________________________   INITIAL IMPRESSION / ASSESSMENT AND PLAN / ED COURSE  Pertinent labs & imaging results that were available during my care of the patient were reviewed by me and considered in my medical decision making (see chart for details).   This patient is Presenting for Evaluation of ankle pain, which does require a range of treatment options, and is a complaint that involves a moderate risk of morbidity and mortality.  The Differential Diagnoses include fracture, dislocation, sprain, etc.  Critical Interventions-    Medications  LORazepam (ATIVAN) tablet 1 mg (1 mg Oral Patient Refused/Not Given 08/23/23 0602)  ibuprofen (ADVIL) tablet 400 mg (400 mg Oral Given 08/23/23 0530)  HYDROcodone-acetaminophen (NORCO/VICODIN) 5-325 MG per tablet 1 tablet (1 tablet Oral Given 08/23/23 1133)  etomidate (AMIDATE) injection 10 mg (10 mg Intravenous Given 08/23/23 1245)  morphine (PF) 4 MG/ML injection 4 mg (4 mg Intravenous Given 08/23/23 1227)  ondansetron (ZOFRAN) injection 4 mg (4 mg Intravenous Given 08/23/23 1225)    Reassessment after intervention:  pain improved.    Radiologic Tests Ordered, included ankle XR pre and post reduction. I independently interpreted the images and agree with radiology interpretation.   Cardiac Monitor Tracing which shows NSR.    Social Determinants of Health Risk history of EtOH use.   Consult complete with ortho Dr. Thad Ranger. Will obtain CT prior to d/c with operative planning. Patient to call the office this afternoon for an appointment either tomorrow vs next week.   Medical Decision Making: Summary:  Presents emergency department left ankle pain and swelling.  X-rays ordered and pending.  Patient appears to have come in fairly intoxicated, which he endorses to me, overnight with multiple outbursts and agitation.  On my assessment, he is awake, alert, pleasant.  He is following all commands.  I do not see any outward sign of trauma to the head or face.  He appears to have sobered clinically and return to baseline.  I will follow his x-rays and reassess.  Reevaluation with update and discussion with patient. Tolerated procedure well. Plan for CT and d/c after that. Pain meds called to the pharmacy and follow up plan discussed. Patient needs to remain non-weight bearing and elevate ankle above the level of the heart.   Patient's presentation is most consistent with acute, uncomplicated illness.   Disposition: discharge  ____________________________________________  FINAL CLINICAL IMPRESSION(S) / ED DIAGNOSES  Final diagnoses:  Closed fracture of left ankle, initial encounter  Fall, initial encounter     NEW  OUTPATIENT MEDICATIONS STARTED DURING THIS VISIT:  New Prescriptions   METHOCARBAMOL (ROBAXIN) 500 MG TABLET    Take 1 tablet (500 mg total) by mouth every 8 (eight) hours as needed for muscle spasms.   NAPROXEN (NAPROSYN) 500 MG TABLET    Take 1 tablet (500 mg total) by mouth 2 (two) times daily for 10 days.   OXYCODONE-ACETAMINOPHEN (PERCOCET/ROXICET) 5-325 MG TABLET    Take 1 tablet by mouth every 6 (six)  hours as needed for severe pain.   SENNA-DOCUSATE (SENOKOT-S) 8.6-50 MG TABLET    Take 1 tablet by mouth at bedtime as needed for mild constipation.    Note:  This document was prepared using Dragon voice recognition software and may include unintentional dictation errors.  Alona Bene, MD, [facility] Emergency Medicine    Tajee Savant, Arlyss Repress, MD 08/23/23 [phone]

## 2023-08-26 ENCOUNTER — Other Ambulatory Visit: Payer: Self-pay | Admitting: Internal Medicine

## 2023-08-26 DIAGNOSIS — I10 Essential (primary) hypertension: Secondary | ICD-10-CM

## 2023-08-28 DIAGNOSIS — S82852A Displaced trimalleolar fracture of left lower leg, initial encounter for closed fracture: Secondary | ICD-10-CM | POA: Diagnosis not present

## 2023-09-03 DIAGNOSIS — G8918 Other acute postprocedural pain: Secondary | ICD-10-CM | POA: Diagnosis not present

## 2023-09-03 DIAGNOSIS — S82852A Displaced trimalleolar fracture of left lower leg, initial encounter for closed fracture: Secondary | ICD-10-CM | POA: Diagnosis not present

## 2023-09-15 ENCOUNTER — Other Ambulatory Visit: Payer: Self-pay | Admitting: Internal Medicine

## 2023-09-15 DIAGNOSIS — I10 Essential (primary) hypertension: Secondary | ICD-10-CM

## 2023-09-16 ENCOUNTER — Encounter: Payer: Self-pay | Admitting: Internal Medicine

## 2023-09-23 ENCOUNTER — Encounter: Payer: Self-pay | Admitting: Internal Medicine

## 2023-09-23 ENCOUNTER — Ambulatory Visit: Payer: 59

## 2023-10-17 ENCOUNTER — Ambulatory Visit: Payer: 59

## 2023-10-21 ENCOUNTER — Encounter (HOSPITAL_COMMUNITY): Payer: Self-pay

## 2023-10-21 ENCOUNTER — Other Ambulatory Visit: Payer: Self-pay

## 2023-10-21 ENCOUNTER — Inpatient Hospital Stay (HOSPITAL_COMMUNITY)
Admission: EM | Admit: 2023-10-21 | Discharge: 2023-10-23 | DRG: 378 | Disposition: A | Discharge status: Home / Self Care | Payer: 59 | Attending: Internal Medicine | Admitting: Internal Medicine

## 2023-10-21 DIAGNOSIS — Z811 Family history of alcohol abuse and dependence: Secondary | ICD-10-CM | POA: Diagnosis not present

## 2023-10-21 DIAGNOSIS — K2100 Gastro-esophageal reflux disease with esophagitis, without bleeding: Secondary | ICD-10-CM

## 2023-10-21 DIAGNOSIS — D62 Acute posthemorrhagic anemia: Secondary | ICD-10-CM | POA: Diagnosis present

## 2023-10-21 DIAGNOSIS — F17290 Nicotine dependence, other tobacco product, uncomplicated: Secondary | ICD-10-CM | POA: Diagnosis present

## 2023-10-21 DIAGNOSIS — Z818 Family history of other mental and behavioral disorders: Secondary | ICD-10-CM | POA: Diagnosis not present

## 2023-10-21 DIAGNOSIS — M25572 Pain in left ankle and joints of left foot: Secondary | ICD-10-CM | POA: Diagnosis present

## 2023-10-21 DIAGNOSIS — F1011 Alcohol abuse, in remission: Secondary | ICD-10-CM | POA: Diagnosis not present

## 2023-10-21 DIAGNOSIS — F419 Anxiety disorder, unspecified: Secondary | ICD-10-CM | POA: Diagnosis present

## 2023-10-21 DIAGNOSIS — Z83719 Family history of colon polyps, unspecified: Secondary | ICD-10-CM | POA: Diagnosis not present

## 2023-10-21 DIAGNOSIS — K279 Peptic ulcer, site unspecified, unspecified as acute or chronic, without hemorrhage or perforation: Secondary | ICD-10-CM

## 2023-10-21 DIAGNOSIS — D500 Iron deficiency anemia secondary to blood loss (chronic): Secondary | ICD-10-CM | POA: Diagnosis not present

## 2023-10-21 DIAGNOSIS — K259 Gastric ulcer, unspecified as acute or chronic, without hemorrhage or perforation: Secondary | ICD-10-CM | POA: Diagnosis not present

## 2023-10-21 DIAGNOSIS — Z8261 Family history of arthritis: Secondary | ICD-10-CM | POA: Diagnosis not present

## 2023-10-21 DIAGNOSIS — K3189 Other diseases of stomach and duodenum: Secondary | ICD-10-CM | POA: Diagnosis not present

## 2023-10-21 DIAGNOSIS — K2210 Ulcer of esophagus without bleeding: Secondary | ICD-10-CM | POA: Diagnosis present

## 2023-10-21 DIAGNOSIS — K922 Gastrointestinal hemorrhage, unspecified: Secondary | ICD-10-CM | POA: Diagnosis present

## 2023-10-21 DIAGNOSIS — I10 Essential (primary) hypertension: Secondary | ICD-10-CM | POA: Diagnosis present

## 2023-10-21 DIAGNOSIS — K2980 Duodenitis without bleeding: Secondary | ICD-10-CM | POA: Diagnosis present

## 2023-10-21 DIAGNOSIS — K920 Hematemesis: Secondary | ICD-10-CM | POA: Diagnosis present

## 2023-10-21 DIAGNOSIS — M5030 Other cervical disc degeneration, unspecified cervical region: Secondary | ICD-10-CM | POA: Diagnosis present

## 2023-10-21 DIAGNOSIS — K921 Melena: Secondary | ICD-10-CM | POA: Diagnosis not present

## 2023-10-21 DIAGNOSIS — E785 Hyperlipidemia, unspecified: Secondary | ICD-10-CM | POA: Diagnosis present

## 2023-10-21 DIAGNOSIS — Z79899 Other long term (current) drug therapy: Secondary | ICD-10-CM

## 2023-10-21 DIAGNOSIS — N179 Acute kidney failure, unspecified: Secondary | ICD-10-CM | POA: Diagnosis present

## 2023-10-21 DIAGNOSIS — F32A Depression, unspecified: Secondary | ICD-10-CM | POA: Diagnosis present

## 2023-10-21 DIAGNOSIS — T39395A Adverse effect of other nonsteroidal anti-inflammatory drugs [NSAID], initial encounter: Secondary | ICD-10-CM | POA: Diagnosis present

## 2023-10-21 DIAGNOSIS — I959 Hypotension, unspecified: Secondary | ICD-10-CM | POA: Diagnosis present

## 2023-10-21 DIAGNOSIS — K2970 Gastritis, unspecified, without bleeding: Secondary | ICD-10-CM | POA: Diagnosis present

## 2023-10-21 DIAGNOSIS — Z791 Long term (current) use of non-steroidal anti-inflammatories (NSAID): Secondary | ICD-10-CM | POA: Diagnosis not present

## 2023-10-21 DIAGNOSIS — K254 Chronic or unspecified gastric ulcer with hemorrhage: Principal | ICD-10-CM

## 2023-10-21 DIAGNOSIS — E876 Hypokalemia: Secondary | ICD-10-CM | POA: Diagnosis present

## 2023-10-21 DIAGNOSIS — F1010 Alcohol abuse, uncomplicated: Secondary | ICD-10-CM | POA: Diagnosis present

## 2023-10-21 DIAGNOSIS — K2211 Ulcer of esophagus with bleeding: Principal | ICD-10-CM | POA: Diagnosis present

## 2023-10-21 LAB — COMPREHENSIVE METABOLIC PANEL
ALT: 16 U/L (ref 0–44)
AST: 18 U/L (ref 15–41)
Albumin: 3.3 g/dL — ABNORMAL LOW (ref 3.5–5.0)
Alkaline Phosphatase: 50 U/L (ref 38–126)
Anion gap: 12 (ref 5–15)
BUN: 52 mg/dL — ABNORMAL HIGH (ref 6–20)
CO2: 23 mmol/L (ref 22–32)
Calcium: 9.2 mg/dL (ref 8.9–10.3)
Chloride: 100 mmol/L (ref 98–111)
Creatinine, Ser: 1.7 mg/dL — ABNORMAL HIGH (ref 0.61–1.24)
GFR, Estimated: 46 mL/min — ABNORMAL LOW (ref >60)
Glucose, Bld: 131 mg/dL — ABNORMAL HIGH (ref 70–99)
Potassium: 2.8 mmol/L — ABNORMAL LOW (ref 3.5–5.1)
Sodium: 135 mmol/L (ref 135–145)
Total Bilirubin: 0.5 mg/dL (ref <1.2)
Total Protein: 6 g/dL — ABNORMAL LOW (ref 6.5–8.1)

## 2023-10-21 LAB — BASIC METABOLIC PANEL
Anion gap: 7 (ref 5–15)
BUN: 37 mg/dL — ABNORMAL HIGH (ref 6–20)
CO2: 23 mmol/L (ref 22–32)
Calcium: 8.4 mg/dL — ABNORMAL LOW (ref 8.9–10.3)
Chloride: 107 mmol/L (ref 98–111)
Creatinine, Ser: 1.16 mg/dL (ref 0.61–1.24)
GFR, Estimated: >60 mL/min (ref >60)
Glucose, Bld: 102 mg/dL — ABNORMAL HIGH (ref 70–99)
Potassium: 2.9 mmol/L — ABNORMAL LOW (ref 3.5–5.1)
Sodium: 137 mmol/L (ref 135–145)

## 2023-10-21 LAB — CBC WITH DIFFERENTIAL/PLATELET
Abs Immature Granulocytes: 0.17 10*3/uL — ABNORMAL HIGH (ref 0.00–0.07)
Basophils Absolute: 0 10*3/uL (ref 0.0–0.1)
Basophils Relative: 0 %
Eosinophils Absolute: 0.1 10*3/uL (ref 0.0–0.5)
Eosinophils Relative: 0 %
HCT: 20.6 % — ABNORMAL LOW (ref 39.0–52.0)
Hemoglobin: 7 g/dL — ABNORMAL LOW (ref 13.0–17.0)
Immature Granulocytes: 1 %
Lymphocytes Relative: 15 %
Lymphs Abs: 2.1 10*3/uL (ref 0.7–4.0)
MCH: 33.8 pg (ref 26.0–34.0)
MCHC: 34 g/dL (ref 30.0–36.0)
MCV: 99.5 fL (ref 80.0–100.0)
Monocytes Absolute: 0.9 10*3/uL (ref 0.1–1.0)
Monocytes Relative: 6 %
Neutro Abs: 11 10*3/uL — ABNORMAL HIGH (ref 1.7–7.7)
Neutrophils Relative %: 78 %
Platelets: 260 10*3/uL (ref 150–400)
RBC: 2.07 MIL/uL — ABNORMAL LOW (ref 4.22–5.81)
RDW: 14.7 % (ref 11.5–15.5)
WBC: 14.3 10*3/uL — ABNORMAL HIGH (ref 4.0–10.5)
nRBC: 0 % (ref 0.0–0.2)

## 2023-10-21 LAB — HEMOGLOBIN AND HEMATOCRIT, BLOOD
HCT: 23.9 % — ABNORMAL LOW (ref 39.0–52.0)
Hemoglobin: 8.3 g/dL — ABNORMAL LOW (ref 13.0–17.0)

## 2023-10-21 LAB — I-STAT CHEM 8, ED
BUN: 51 mg/dL — ABNORMAL HIGH (ref 6–20)
Calcium, Ion: 1.29 mmol/L (ref 1.15–1.40)
Chloride: 99 mmol/L (ref 98–111)
Creatinine, Ser: 1.7 mg/dL — ABNORMAL HIGH (ref 0.61–1.24)
Glucose, Bld: 129 mg/dL — ABNORMAL HIGH (ref 70–99)
HCT: 20 % — ABNORMAL LOW (ref 39.0–52.0)
Hemoglobin: 6.8 g/dL — CL (ref 13.0–17.0)
Potassium: 2.8 mmol/L — ABNORMAL LOW (ref 3.5–5.1)
Sodium: 137 mmol/L (ref 135–145)
TCO2: 25 mmol/L (ref 22–32)

## 2023-10-21 LAB — PROTIME-INR
INR: 1 (ref 0.8–1.2)
Prothrombin Time: 13.7 s (ref 11.4–15.2)

## 2023-10-21 LAB — IRON AND TIBC
Iron: 253 ug/dL — ABNORMAL HIGH (ref 45–182)
Saturation Ratios: 84 % — ABNORMAL HIGH (ref 17.9–39.5)
TIBC: 302 ug/dL (ref 250–450)
UIBC: 49 ug/dL

## 2023-10-21 LAB — ABO/RH: ABO/RH(D): O NEG

## 2023-10-21 LAB — MAGNESIUM: Magnesium: 1.7 mg/dL (ref 1.7–2.4)

## 2023-10-21 LAB — LIPASE, BLOOD: Lipase: 33 U/L (ref 11–51)

## 2023-10-21 LAB — ETHANOL: Alcohol, Ethyl (B): <10 mg/dL (ref <10)

## 2023-10-21 LAB — PREPARE RBC (CROSSMATCH)

## 2023-10-21 MED ORDER — SODIUM CHLORIDE 0.9 % IV BOLUS
1000.0000 mL | Freq: Once | INTRAVENOUS | Status: AC
  Administered 2023-10-21: 1000 mL via INTRAVENOUS

## 2023-10-21 MED ORDER — PANTOPRAZOLE SODIUM 40 MG IV SOLR
80.0000 mg | Freq: Once | INTRAVENOUS | Status: AC
  Administered 2023-10-21: 80 mg via INTRAVENOUS
  Filled 2023-10-21: qty 20

## 2023-10-21 MED ORDER — OLOPATADINE HCL 0.1 % OP SOLN
1.0000 [drp] | Freq: Once | OPHTHALMIC | Status: AC
  Administered 2023-10-22: 1 [drp] via OPHTHALMIC
  Filled 2023-10-21: qty 5

## 2023-10-21 MED ORDER — ONDANSETRON HCL 4 MG/2ML IJ SOLN
4.0000 mg | Freq: Once | INTRAMUSCULAR | Status: AC
  Administered 2023-10-21: 4 mg via INTRAVENOUS
  Filled 2023-10-21: qty 2

## 2023-10-21 MED ORDER — SODIUM CHLORIDE 0.9% IV SOLUTION: Freq: Once | INTRAVENOUS | Status: AC

## 2023-10-21 MED ORDER — POTASSIUM CHLORIDE CRYS ER 20 MEQ PO TBCR
40.0000 meq | EXTENDED_RELEASE_TABLET | Freq: Once | ORAL | Status: AC
  Administered 2023-10-21: 40 meq via ORAL
  Filled 2023-10-21: qty 2

## 2023-10-21 MED ORDER — PANTOPRAZOLE SODIUM 40 MG IV SOLR
40.0000 mg | Freq: Two times a day (BID) | INTRAVENOUS | Status: DC
  Administered 2023-10-21: 40 mg via INTRAVENOUS
  Filled 2023-10-21: qty 10

## 2023-10-21 MED ORDER — NICOTINE 21 MG/24HR TD PT24
21.0000 mg | MEDICATED_PATCH | Freq: Every day | TRANSDERMAL | Status: DC
  Administered 2023-10-21 – 2023-10-23 (×3): 21 mg via TRANSDERMAL
  Filled 2023-10-21 (×3): qty 1

## 2023-10-21 NOTE — ED Notes (Signed)
GI MD at bedside

## 2023-10-21 NOTE — ED Provider Notes (Signed)
[place] EMERGENCY DEPARTMENT AT [facility] Provider Note  CSN: [id number] Arrival date & time: 10/21/23 7829  Chief Complaint(s) Weakness and Nausea  HPI Collin Horne is a 57 y.o. male history of alcohol use presenting to the emergency department with vomiting.  Patient reports that today developed vomiting dark vomit.  Also reports black stools for the past 2 days.  Reports generalized weakness.  Today was weak to the point where he could not get off the floor and had to call paramedics who brought him in.  Paramedics report that he was hypotensive.  Denies any pain.  He reports that late last week he also had some episodes of dark vomit which resolved.  No syncope.  No chest pain or shortness of breath.  No abdominal pain.  Endorses some alcohol use occasionally not recently.  No similar episode in the past.   Past Medical History Past Medical History:  Diagnosis Date   Anemia    Anxiety    Depression    Hyperlipidemia    Hypertension    Substance abuse (HCC)    Patient Active Problem List   Diagnosis Date Noted   Upper GI bleed 10/21/2023   Anemia due to acquired thiamine deficiency 04/08/2023   Deficiency anemia 04/01/2023   Dyslipidemia, goal LDL below 70 12/25/2022   Colon cancer screening 12/25/2022   Hypertension 12/24/2022   Low testosterone in male 10/02/2022   Erectile dysfunction due to arterial insufficiency 04/12/2022   Encounter for general adult medical examination with abnormal findings 12/21/2021   Vitamin B12 deficiency anemia due to intrinsic factor deficiency 09/03/2018   Pure hyperglyceridemia 07/16/2017   Facial dermatitis 07/16/2017   Seborrheic dermatitis 06/17/2017   DDD (degenerative disc disease), cervical 06/17/2017   Tobacco abuse 04/10/2016   Alcohol abuse, in remission 04/10/2016   Home Medication(s) Prior to Admission medications   Medication Sig Start Date End Date Taking? Authorizing Provider  Vitamin D, Ergocalciferol,  (DRISDOL) 1.25 MG (50000 UNIT) CAPS capsule Take 1 capsule by mouth once a week. 09/03/23 12/02/23 Yes [provider]  atorvastatin (LIPITOR) 20 MG tablet TAKE 1 TABLET BY MOUTH EVERY DAY 03/30/23   Etta Grandchild, MD  desvenlafaxine (PRISTIQ) 100 MG 24 hr tablet Take 100 mg by mouth daily.    [provider]  gabapentin (NEURONTIN) 600 MG tablet Take 800 mg by mouth 3 (three) times daily before meals.    [provider]  ketoconazole (NIZORAL) 2 % shampoo Apply 1 Application topically 2 (two) times a week. 12/24/22   Etta Grandchild, MD  lamoTRIgine (LAMICTAL) 150 MG tablet Take 100 mg by mouth daily.    [provider]  magnesium 30 MG tablet Take 30 mg by mouth 2 (two) times daily.    [provider]  methocarbamol (ROBAXIN) 500 MG tablet Take 1 tablet (500 mg total) by mouth every 8 (eight) hours as needed for muscle spasms. 08/23/23   Long, Arlyss Repress, MD  olmesartan (BENICAR) 20 MG tablet TAKE 1 TABLET BY MOUTH DAILY. FOLLOW-UP APPT DUE IN SEPT MUST SEE PROVIDER FOR FUTURE REFILLS 08/26/23   Etta Grandchild, MD  oxyCODONE-acetaminophen (PERCOCET/ROXICET) 5-325 MG tablet Take 1 tablet by mouth every 6 (six) hours as needed for severe pain. 08/23/23   Long, Arlyss Repress, MD  propranolol (INDERAL) 10 MG tablet Take by mouth. 12/31/22   [provider]  senna-docusate (SENOKOT-S) 8.6-50 MG tablet Take 1 tablet by mouth at bedtime as needed for mild constipation. 08/23/23  Long, Arlyss Repress, MD  sildenafil (VIAGRA) 100 MG tablet Take 1 tablet (100 mg total) by mouth daily as needed for erectile dysfunction. 08/07/22   Etta Grandchild, MD  thiamine (VITAMIN B-1) 50 MG tablet Take 1 tablet (50 mg total) by mouth daily. 04/08/23   Etta Grandchild, MD  traZODone (DESYREL) 50 MG tablet Take by mouth. 11/15/20   [provider]                                                                                                                                     Past Surgical History Past Surgical History:  Procedure Laterality Date   ANKLE SURGERY  11/12/2005   COLONOSCOPY     SIGMOIDOSCOPY     Family History Family History  Problem Relation Age of Onset   Arthritis Mother    Alcohol abuse Father    Depression Father    Early death Father    Depression Sister    Colon polyps Maternal Grandfather    Heart disease Neg Hx    Hyperlipidemia Neg Hx    Hypertension Neg Hx    Kidney disease Neg Hx    Stroke Neg Hx    Asthma Neg Hx    Colon cancer Neg Hx    Esophageal cancer Neg Hx    Rectal cancer Neg Hx    Stomach cancer Neg Hx     Social History Social History   Tobacco Use   Smoking status: Former    Current packs/day: 0.00    Average packs/day: 1 pack/day for 30.0 years (30.0 ttl pk-yrs)    Types: Cigarettes, E-cigarettes    Start date: 06/21/1990    Quit date: 06/21/2020    Years since quitting: 3.3   Smokeless tobacco: Never  Vaping Use   Vaping status: Every Day  Substance Use Topics   Alcohol use: Yes    Comment: once a month   Drug use: Never   Allergies Patient has no known allergies.  Review of Systems Review of Systems  All other systems reviewed and are negative.   Physical Exam Vital Signs  I have reviewed the triage vital signs BP (!) 100/57   Pulse (!) 109   Temp 99 F (37.2 C) (Oral)   Resp 17   Ht 5\' 5"  (1.651 m)   Wt 68 kg   SpO2 100%   BMI 24.96 kg/m  Physical Exam Vitals and nursing note reviewed.  Constitutional:      General: He is not in acute distress.    Appearance: Normal appearance.  HENT:     Mouth/Throat:     Mouth: Mucous membranes are moist.  Eyes:     Conjunctiva/sclera: Conjunctivae normal.  Cardiovascular:     Rate and Rhythm: Regular rhythm. Tachycardia present.  Pulmonary:     Effort: Pulmonary effort is normal. No respiratory distress.     Breath sounds: Normal breath  sounds.  Abdominal:     General: Abdomen is flat.     Palpations: Abdomen is soft.      Tenderness: There is no abdominal tenderness.  Musculoskeletal:     Right lower leg: No edema.     Left lower leg: No edema.  Skin:    General: Skin is warm and dry.     Capillary Refill: Capillary refill takes less than 2 seconds.     Coloration: Skin is pale.  Neurological:     Mental Status: He is alert and oriented to person, place, and time. Mental status is at baseline.  Psychiatric:        Mood and Affect: Mood normal.        Behavior: Behavior normal.     ED Results and Treatments Labs (all labs ordered are listed, but only abnormal results are displayed) Labs Reviewed  COMPREHENSIVE METABOLIC PANEL - Abnormal; Notable for the following components:      Result Value   Potassium 2.8 (*)    Glucose, Bld 131 (*)    BUN 52 (*)    Creatinine, Ser 1.70 (*)    Total Protein 6.0 (*)    Albumin 3.3 (*)    GFR, Estimated 46 (*)    All other components within normal limits  CBC WITH DIFFERENTIAL/PLATELET - Abnormal; Notable for the following components:   WBC 14.3 (*)    RBC 2.07 (*)    Hemoglobin 7.0 (*)    HCT 20.6 (*)    Neutro Abs 11.0 (*)    Abs Immature Granulocytes 0.17 (*)    All other components within normal limits  I-STAT CHEM 8, ED - Abnormal; Notable for the following components:   Potassium 2.8 (*)    BUN 51 (*)    Creatinine, Ser 1.70 (*)    Glucose, Bld 129 (*)    Hemoglobin 6.8 (*)    HCT 20.0 (*)    All other components within normal limits  LIPASE, BLOOD  PROTIME-INR  ETHANOL  HEMOGLOBIN AND HEMATOCRIT, BLOOD  HEMOGLOBIN AND HEMATOCRIT, BLOOD  POTASSIUM  TYPE AND SCREEN  ABO/RH  PREPARE RBC (CROSSMATCH)                                                                                                                          Radiology No results found.  Pertinent labs & imaging results that were available during my care of the patient were reviewed by me and considered in my medical decision making (see MDM for details).  Medications Ordered  in ED Medications  pantoprazole (PROTONIX) injection 40 mg (has no administration in time range)  olopatadine (PATANOL) 0.1 % ophthalmic solution 1 drop (has no administration in time range)  potassium chloride SA (KLOR-CON M) CR tablet 40 mEq (has no administration in time range)  sodium chloride 0.9 % bolus 1,000 mL (0 mLs Intravenous Stopped 10/21/23 1030)  pantoprazole (PROTONIX) injection 80 mg (80 mg Intravenous Given 10/21/23 1037)  0.9 %  sodium chloride infusion (Manually program via Guardrails IV Fluids) ( Intravenous New Bag/Given 10/21/23 1157)  ondansetron (ZOFRAN) injection 4 mg (4 mg Intravenous Given 10/21/23 1037)  potassium chloride SA (KLOR-CON M) CR tablet 40 mEq (40 mEq Oral Given 10/21/23 1055)                                                                                                                                     Procedures .Critical Care  Performed by: Lonell Grandchild, MD Authorized by: Lonell Grandchild, MD   Critical care provider statement:    Critical care time (minutes):  30   Critical care was necessary to treat or prevent imminent or life-threatening deterioration of the following conditions:  Circulatory failure   Critical care was time spent personally by me on the following activities:  Development of treatment plan with patient or surrogate, discussions with consultants, evaluation of patient's response to treatment, examination of patient, ordering and review of laboratory studies, ordering and review of radiographic studies, ordering and performing treatments and interventions, pulse oximetry, re-evaluation of patient's condition and review of old charts   (including critical care time)  Medical Decision Making / ED Course   MDM:  57 year old presenting to the emergency department with weakness.  Patient appears pale.  Vitals with tachycardia.  Symptoms seem most consistent with GI bleeding.  Will check hemoglobin.  Will check labs to  evaluate for other causes of weakness.  No abdominal tenderness to suggest any acute intra-abdominal process such as obstruction or perforation, pancreatitis, cholecystitis.  Will reassess.  Patient consents to blood transfusion if necessary.  No history of cirrhosis  Clinical Course as of 10/21/23 1247  Mon Oct 21, 2023  1019 Discussed with [facility] Victorino Dike who will consult. Patient anemic, consented for txfusion.  [WS]  1246 Patient admitted by Dr. Maisie Fus [WS]    Clinical Course User Index [WS] Suezanne Jacquet Jerilee Field, MD     Additional history obtained: -Additional history obtained from ems -External records from outside source obtained and reviewed including: Chart review including previous notes, labs, imaging, consultation notes including prior notes    Lab Tests: -I ordered, reviewed, and interpreted labs.   The pertinent results include:   Labs Reviewed  COMPREHENSIVE METABOLIC PANEL - Abnormal; Notable for the following components:      Result Value   Potassium 2.8 (*)    Glucose, Bld 131 (*)    BUN 52 (*)    Creatinine, Ser 1.70 (*)    Total Protein 6.0 (*)    Albumin 3.3 (*)    GFR, Estimated 46 (*)    All other components within normal limits  CBC WITH DIFFERENTIAL/PLATELET - Abnormal; Notable for the following components:   WBC 14.3 (*)    RBC 2.07 (*)    Hemoglobin 7.0 (*)    HCT 20.6 (*)    Neutro Abs 11.0 (*)  Abs Immature Granulocytes 0.17 (*)    All other components within normal limits  I-STAT CHEM 8, ED - Abnormal; Notable for the following components:   Potassium 2.8 (*)    BUN 51 (*)    Creatinine, Ser 1.70 (*)    Glucose, Bld 129 (*)    Hemoglobin 6.8 (*)    HCT 20.0 (*)    All other components within normal limits  LIPASE, BLOOD  PROTIME-INR  ETHANOL  HEMOGLOBIN AND HEMATOCRIT, BLOOD  HEMOGLOBIN AND HEMATOCRIT, BLOOD  POTASSIUM  TYPE AND SCREEN  ABO/RH  PREPARE RBC (CROSSMATCH)    Notable for anemia, hypokalemia, AKI   EKG   EKG  Interpretation Date/Time:  Monday October 21 2023 08:50:22 EST Ventricular Rate:  113 PR Interval:  114 QRS Duration:  81 QT Interval:  413 QTC Calculation: 567 R Axis:   35  Text Interpretation: Sinus tachycardia Prolonged QT interval Confirmed by Alvino Blood ([id number]) on 10/21/2023 9:09:27 AM           Medicines ordered and prescription drug management: Meds ordered this encounter  Medications   sodium chloride 0.9 % bolus 1,000 mL   pantoprazole (PROTONIX) injection 80 mg   0.9 %  sodium chloride infusion (Manually program via Guardrails IV Fluids)   ondansetron (ZOFRAN) injection 4 mg   potassium chloride SA (KLOR-CON M) CR tablet 40 mEq   pantoprazole (PROTONIX) injection 40 mg   olopatadine (PATANOL) 0.1 % ophthalmic solution 1 drop   potassium chloride SA (KLOR-CON M) CR tablet 40 mEq    -I have reviewed the patients home medicines and have made adjustments as needed   Consultations Obtained: I requested consultation with the gastroenterologist,  and discussed lab and imaging findings as well as pertinent plan - they recommend: admission   Cardiac Monitoring: The patient was maintained on a cardiac monitor.  I personally viewed and interpreted the cardiac monitored which showed an underlying rhythm of: NSR  Social Determinants of Health:  Diagnosis or treatment significantly limited by social determinants of health: alcohol use   Reevaluation: After the interventions noted above, I reevaluated the patient and found that their symptoms have improved  Co morbidities that complicate the patient evaluation  Past Medical History:  Diagnosis Date   Anemia    Anxiety    Depression    Hyperlipidemia    Hypertension    Substance abuse (HCC)       Dispostion: Disposition decision including need for hospitalization was considered, and patient admitted to the hospital.    Final Clinical Impression(s) / ED Diagnoses Final diagnoses:  Upper GI bleeding      This chart was dictated using voice recognition software.  Despite best efforts to proofread,  errors can occur which can change the documentation meaning.    Lonell Grandchild, MD 10/21/23 1247

## 2023-10-21 NOTE — Consult Note (Addendum)
Consultation  Referring Provider: Dr. Rhoderick Moody    Primary Care Physician:  Etta Grandchild, MD Primary Gastroenterologist: Dr. Myrtie Neither       Reason for Consultation: Coffee-ground emesis         HPI:   Collin Horne is a 57 y.o. male with history as listed below including depression and alcohol use who presented to the ER after developing dark vomit.    Today, the patient describes that he was a heavy drinker for years but has not drank heavily in the past 3 years.  In fact his last alcoholic drink was a week ago.  He no longer drinks on a regular basis.  He does tell me though that about 2 months ago now he had a broken ankle which had to be repaired and over this time.  He has been using a lot of pain medications including NSAIDs, sometimes multiple a day recently.  Describes that on 10/15/2023 he started feeling just "bad", and was able to get to sleep but woke up the next morning vomiting.  He continuously vomited for the last week with inability to really eat or drink anything and then this morning around 7 AM started with coffee-ground emesis multiple times.  Tells me he started noticing black tarry stools on Saturday, 10/19/2023.  Along with this is experiencing some epigastric discomfort, but thinks this is from all of his vomiting.    Denies fever, chills or weight loss.  ER Course: Hemoglobin 6.8 (10.8 on 04/04/2023), potassium 2.8, BUN 51, creatinine 1.7, alcohol level less than 10, lipase normal, INR normal, 1 unit PRBCs ordered  GI history: 03/18/2023 colonoscopy with 3 diminutive polyps in the descending and ascending colon, internal hemorrhoids and otherwise normal; pathology showed adenomas and repeat recommended in 5 years  Past Medical History:  Diagnosis Date  . Anemia   . Anxiety   . Depression   . Hyperlipidemia   . Hypertension   . Substance abuse [facility])     Past Surgical History:  Procedure Laterality Date  . ANKLE SURGERY  11/12/2005  . COLONOSCOPY    .  SIGMOIDOSCOPY      Family History  Problem Relation Age of Onset  . Arthritis Mother   . Alcohol abuse Father   . Depression Father   . Early death Father   . Depression Sister   . Colon polyps Maternal Grandfather   . Heart disease Neg Hx   . Hyperlipidemia Neg Hx   . Hypertension Neg Hx   . Kidney disease Neg Hx   . Stroke Neg Hx   . Asthma Neg Hx   . Colon cancer Neg Hx   . Esophageal cancer Neg Hx   . Rectal cancer Neg Hx   . Stomach cancer Neg Hx     Social History   Tobacco Use  . Smoking status: Former    Current packs/day: 0.00    Average packs/day: 1 pack/day for 30.0 years (30.0 ttl pk-yrs)    Types: Cigarettes, E-cigarettes    Start date: 06/21/1990    Quit date: 06/21/2020    Years since quitting: 3.3  . Smokeless tobacco: Never  Vaping Use  . Vaping status: Every Day  Substance Use Topics  . Alcohol use: Yes    Comment: once a month  . Drug use: Never    Prior to Admission medications   Medication Sig Start Date End Date Taking? Authorizing Provider  atorvastatin (LIPITOR) 20 MG tablet TAKE 1 TABLET BY  MOUTH EVERY DAY 03/30/23   Etta Grandchild, MD  desvenlafaxine (PRISTIQ) 100 MG 24 hr tablet Take 100 mg by mouth daily.    [provider]  gabapentin (NEURONTIN) 600 MG tablet Take 800 mg by mouth 3 (three) times daily before meals.    [provider]  ketoconazole (NIZORAL) 2 % shampoo Apply 1 Application topically 2 (two) times a week. 12/24/22   Etta Grandchild, MD  lamoTRIgine (LAMICTAL) 150 MG tablet Take 100 mg by mouth daily.    [provider]  magnesium 30 MG tablet Take 30 mg by mouth 2 (two) times daily.    [provider]  methocarbamol (ROBAXIN) 500 MG tablet Take 1 tablet (500 mg total) by mouth every 8 (eight) hours as needed for muscle spasms. 08/23/23   Long, Arlyss Repress, MD  olmesartan (BENICAR) 20 MG tablet TAKE 1 TABLET BY MOUTH DAILY. FOLLOW-UP APPT DUE IN SEPT MUST SEE PROVIDER FOR FUTURE REFILLS  08/26/23   Etta Grandchild, MD  oxyCODONE-acetaminophen (PERCOCET/ROXICET) 5-325 MG tablet Take 1 tablet by mouth every 6 (six) hours as needed for severe pain. 08/23/23   Long, Arlyss Repress, MD  propranolol (INDERAL) 10 MG tablet Take by mouth. 12/31/22   [provider]  senna-docusate (SENOKOT-S) 8.6-50 MG tablet Take 1 tablet by mouth at bedtime as needed for mild constipation. 08/23/23   Long, Arlyss Repress, MD  sildenafil (VIAGRA) 100 MG tablet Take 1 tablet (100 mg total) by mouth daily as needed for erectile dysfunction. 08/07/22   Etta Grandchild, MD  thiamine (VITAMIN B-1) 50 MG tablet Take 1 tablet (50 mg total) by mouth daily. 04/08/23   Etta Grandchild, MD  traZODone (DESYREL) 50 MG tablet Take by mouth. 11/15/20   [provider]    Current Facility-Administered Medications  Medication Dose Route Frequency Provider Last Rate Last Admin  . 0.9 %  sodium chloride infusion (Manually program via Guardrails IV Fluids)   Intravenous Once Lonell Grandchild, MD      . ondansetron [facility]) injection 4 mg  4 mg Intravenous Once Lonell Grandchild, MD      . pantoprazole (PROTONIX) injection 80 mg  80 mg Intravenous Once Lonell Grandchild, MD       Current Outpatient Medications  Medication Sig Dispense Refill  . atorvastatin (LIPITOR) 20 MG tablet TAKE 1 TABLET BY MOUTH EVERY DAY 90 tablet 1  . desvenlafaxine (PRISTIQ) 100 MG 24 hr tablet Take 100 mg by mouth daily.    Marland Kitchen gabapentin (NEURONTIN) 600 MG tablet Take 800 mg by mouth 3 (three) times daily before meals.    Marland Kitchen ketoconazole (NIZORAL) 2 % shampoo Apply 1 Application topically 2 (two) times a week. 120 mL 1  . lamoTRIgine (LAMICTAL) 150 MG tablet Take 100 mg by mouth daily.    . magnesium 30 MG tablet Take 30 mg by mouth 2 (two) times daily.    . methocarbamol (ROBAXIN) 500 MG tablet Take 1 tablet (500 mg total) by mouth every 8 (eight) hours as needed for muscle spasms. 20 tablet 0  . olmesartan (BENICAR) 20 MG tablet TAKE  1 TABLET BY MOUTH DAILY. FOLLOW-UP APPT DUE IN SEPT MUST SEE PROVIDER FOR FUTURE REFILLS 90 tablet 0  . oxyCODONE-acetaminophen (PERCOCET/ROXICET) 5-325 MG tablet Take 1 tablet by mouth every 6 (six) hours as needed for severe pain. 15 tablet 0  . propranolol (INDERAL) 10 MG tablet Take by mouth.    . senna-docusate (SENOKOT-S) 8.6-50 MG  tablet Take 1 tablet by mouth at bedtime as needed for mild constipation. 20 tablet 0  . sildenafil (VIAGRA) 100 MG tablet Take 1 tablet (100 mg total) by mouth daily as needed for erectile dysfunction. 30 tablet 0  . thiamine (VITAMIN B-1) 50 MG tablet Take 1 tablet (50 mg total) by mouth daily. 90 tablet 1  . traZODone (DESYREL) 50 MG tablet Take by mouth.      Allergies as of 10/21/2023  . (No Known Allergies)     Review of Systems:    Constitutional: No weight loss, fever or chills Skin: No rash  Cardiovascular: No chest pain Respiratory: No SOB  Gastrointestinal: See HPI and otherwise negative Genitourinary: No dysuria  Neurological: No headache, dizziness or syncope Musculoskeletal: No new muscle or joint pain Hematologic: No bruising Psychiatric: No history of depression or anxiety    Physical Exam:  Vital signs in last 24 hours: Temp:  [98.6 F (37 C)] 98.6 F (37 C) (12/09 0842) Pulse Rate:  [92-120] 110 (12/09 0950) Resp:  [16-26] 23 (12/09 0950) BP: (87-145)/(35-67) 145/35 (12/09 0950) SpO2:  [98 %-100 %] 98 % (12/09 0950) Weight:  [68 kg] 68 kg (12/09 0842)   General:   Pleasant Caucasian male appears to be in NAD, Well developed, Well nourished, alert and cooperative + visible dark brown stains from vomiting on his again Head:  Normocephalic and atraumatic. Eyes:   PEERL, EOMI. No icterus. Conjunctiva pink. Ears:  Normal auditory acuity. Neck:  Supple Throat: Oral cavity and pharynx without inflammation, swelling or lesion. Teeth in good condition. Lungs: Respirations even and unlabored. Lungs clear to auscultation bilaterally.    No wheezes, crackles, or rhonchi.  Heart: Normal S1, S2. No MRG. Regular rate and rhythm. No peripheral edema, cyanosis or pallor.  Abdomen:  Soft, nondistended, mild epigastric TTP, no rebound or guarding. Normal bowel sounds. No appreciable masses or hepatomegaly. Rectal:  Not performed.  Msk:  Symmetrical without gross deformities. Peripheral pulses intact.  Extremities:  Without edema, no deformity or joint abnormality. Normal ROM, normal sensation. Neurologic:  Alert and  oriented x4;  grossly normal neurologically.  Skin:   Dry and intact without significant lesions or rashes. Psychiatric: Demonstrates good judgement and reason without abnormal affect or behaviors.   LAB RESULTS: Recent Labs    10/21/23 0846 10/21/23 1000  WBC 14.3*  --   HGB 7.0* 6.8*  HCT 20.6* 20.0*  PLT 260  --    BMET Recent Labs    10/21/23 1000  NA 137  K 2.8*  CL 99  GLUCOSE 129*  BUN 51*  CREATININE 1.70*      Latest Ref Rng & Units 10/21/2023    8:46 AM 10/24/2022    7:57 AM 12/21/2021    4:00 PM  Hepatic Function  Total Protein 6.5 - 8.1 g/dL 6.0  7.9  7.5   Albumin 3.5 - 5.0 g/dL 3.3  4.8  4.3   AST 15 - 41 U/L 18  18  14    ALT 0 - 44 U/L 16  15  11    Alk Phosphatase 38 - 126 U/L 50  66  61   Total Bilirubin <1.2 mg/dL 0.5  0.6  0.4   Bilirubin, Direct 0.0 - 0.3 mg/dL   0.1     PT/INR Recent Labs    10/21/23 0846  LABPROT 13.7  INR 1.0     Impression / Plan:   Impression: 1.  Acute blood loss anemia: Hemoglobin 10.86 months  ago--> 7.0--> 6.8, BUN elevated at 51 today coffee-ground emesis started this morning, melena started 48 hours ago, history of heavy NSAID use after recent ankle fracture; likely NSAID related ulcer +/- gastritis +/- esophagitis 2.  Hematemesis 3.  History of alcohol abuse: Patient was a heavy alcohol drinker until 3 years ago, now only occasionally with a last drink a week ago per him 4.  Recent ankle fracture: With NSAID use likely contributory 5.   Hypokalemia: Likely due to recent vomiting  Plan: 1.  Patient will need hemoglobin above 7 and a normal potassium prior to proceeding with EGD.  Due to low potassium we will wait until tomorrow when he is resuscitated for EGD.  Please let us know if any of his symptoms worsen today requiring more urgent EGD.  Did discuss risk and benefits, notations and alternatives of procedure with the patient he agrees to proceed. 2.  N.p.o. for now, pending course of day could have clears until midnight. 3.  Continue to monitor hemoglobin  with transfusion as needed less than 7 4.  Appreciate hospitalist assistance with hypokalemia, potassium is already been ordered 5.  Agree with Pantoprazole 40 mg IV twice daily 6.  Recommend the patient avoid NSAIDs in the future  Thank you for your kind consultation, we will continue to follow.  Violet Baldy Lemmon  10/21/2023, 10:16 AM  I have taken an interval history, thoroughly reviewed the chart and examined the patient. I agree with the Advanced Practitioner's note, impression and recommendations, and have recorded additional findings, impressions and recommendations below. I performed a substantive portion of this encounter (>50% time spent), including a complete performance of the medical decision making.  My additional thoughts are as follows:  57 year old man with about 2 months of NSAID use since an ankle fracture, now with an upper GI bleed and marked acute blood loss anemia. He also has significant hypokalemia, perhaps from recent vomiting and decreased oral intake in the last week because he has not been feeling well. Blood pressure 90/50 right now with a pulse of 110.  However, he is mentating well, denies chest pain or dyspnea, no abdominal pain, cardiopulmonary and abdominal exam benign.  Our current plan is for an upper endoscopy on him tomorrow morning after resuscitation with PRBCs, fluids, potassium as well as Protonix. He needs 2 units of PRBCs  initially with a posttransfusion hemoglobin and hematocrit 2 to 3 hours afterward.  Additional PRBCs as needed.  Then Q 6 hr Hgb/Hct  Thus far he is only received 40 mEq of oral potassium for potassium of 2.8, he needs additional potassium ordered with another level checked this evening and tomorrow morning. I ordered potassium and labs and messaged his ED nurse since medicine service has not yet seen this patient.  In the event that he has continued hypotension and tachycardia despite volume resuscitation, particularly if his hemoglobin does not improve as expected later today and in the overnight hours, please contact our overnight coverage to reevaluate the possibility of a more urgent upper endoscopy.  This is all more reason he should remain n.p.o. for now and more potassium administered.   Charlie Pitter III Office:[phone]

## 2023-10-21 NOTE — ED Triage Notes (Signed)
PT BIB EMS from home after laying himself to the ground after feeling a bout of weakness. found with coffee ground emesis and urine PT endorses N/V/D x2 days. Surrounding him on the floor at home. [facility] reports MAP greater than 65 en route received 50ml of LR, CBG 260, Aox4 and 24g IV in L hand.

## 2023-10-21 NOTE — ED Notes (Signed)
Blood consent received from PT, witnessed by this RN, paper form at bedside

## 2023-10-21 NOTE — H&P (Addendum)
History and Physical    Collin Horne ZOX:[id number] DOB: June 05, 1966 DOA: 10/21/2023  PCP: Collin Grandchild, MD  Patient coming from: home  I have personally briefly reviewed patient's old medical records in [facility]  Chief Complaint:  coffee ground emesis  HPI: Collin Horne is a 57 y.o. male with medical history significant of  Anemia, anxiety, depression, HLD, HTN, ETOH abuse, who presents to Ed with episode of weakness followed by episode of coffee ground emesis.  Patient notes he has had n/v/d x 2 days. Patient notes  darks stools as well x 2 days. Patient states he does drink ETOH  and has used nsaids within the last month.  He also noted mild epigastric discomfort as well.  He denies fever/chills/chest pain/ or sob or cough.   ED Course:  Afeb,  bp91/67, hr 111, sat 100%  Wbc 14.3, hgb 7 (10.8) Na 135, K 2.8, glu 131, cr 1.7 ( 1.06) EKG: snr , qt 567 Tx ns 1L bolus, zofran, protonix,potassium Etoh <10 Review of Systems: As per HPI otherwise 10 point review of systems negative.   Past Medical History:  Diagnosis Date   Anemia    Anxiety    Depression    Hyperlipidemia    Hypertension    Substance abuse (HCC)     Past Surgical History:  Procedure Laterality Date   ANKLE SURGERY  11/12/2005   COLONOSCOPY     SIGMOIDOSCOPY       reports that he quit smoking about 3 years ago. His smoking use included cigarettes and e-cigarettes. He started smoking about 33 years ago. He has a 30 pack-year smoking history. He has never used smokeless tobacco. He reports current alcohol use. He reports that he does not use drugs.  No Known Allergies  Family History  Problem Relation Age of Onset   Arthritis Mother    Alcohol abuse Father    Depression Father    Early death Father    Depression Sister    Colon polyps Maternal Grandfather    Heart disease Neg Hx    Hyperlipidemia Neg Hx    Hypertension Neg Hx    Kidney disease Neg Hx    Stroke Neg Hx    Asthma Neg  Hx    Colon cancer Neg Hx    Esophageal cancer Neg Hx    Rectal cancer Neg Hx    Stomach cancer Neg Hx     Prior to Admission medications   Medication Sig Start Date End Date Taking? Authorizing Provider  atorvastatin (LIPITOR) 20 MG tablet TAKE 1 TABLET BY MOUTH EVERY DAY 03/30/23  Yes Collin Grandchild, MD  desvenlafaxine (PRISTIQ) 100 MG 24 hr tablet Take 100 mg by mouth daily.   Yes [provider]  diphenhydrAMINE (BENADRYL) 25 mg capsule Take 25-75 mg by mouth at bedtime as needed for sleep.   Yes [provider]  gabapentin (NEURONTIN) 800 MG tablet Take 800 mg by mouth 3 (three) times daily as needed (nerve pain).   Yes [provider]  lamoTRIgine (LAMICTAL) 100 MG tablet Take 100 mg by mouth daily.   Yes [provider]  Multiple Vitamins-Minerals (MULTIVITAMIN WITH MINERALS) tablet Take 1 tablet by mouth daily.   Yes [provider]  olmesartan (BENICAR) 20 MG tablet TAKE 1 TABLET BY MOUTH DAILY. FOLLOW-UP APPT DUE IN SEPT MUST SEE PROVIDER FOR FUTURE REFILLS Patient taking differently: Take 20 mg by mouth daily. 08/26/23  Yes Collin Grandchild, MD  propranolol (  INDERAL) 10 MG tablet Take 10 mg by mouth as needed (anxiety). 12/31/22  Yes [provider]  sildenafil (VIAGRA) 100 MG tablet Take 1 tablet (100 mg total) by mouth daily as needed for erectile dysfunction. Patient taking differently: Take 50-100 mg by mouth daily as needed for erectile dysfunction. 08/07/22  Yes Collin Grandchild, MD  traZODone (DESYREL) 50 MG tablet Take 50-100 mg by mouth at bedtime as needed for sleep. 11/15/20  Yes [provider]  Vitamin D, Ergocalciferol, (DRISDOL) 1.25 MG (50000 UNIT) CAPS capsule Take 1 capsule by mouth once a week. 09/03/23 12/02/23 Yes [provider]  HYDROcodone-acetaminophen (NORCO/VICODIN) 5-325 MG tablet SMARTSIG:1.0 Tablet(s) By Mouth Every 6 Hours PRN Patient not taking: Reported on 10/21/2023 09/26/23   [provider]  hydrOXYzine (VISTARIL) 25 MG capsule SMARTSIG:1.0 Capsule(s) By Mouth 3 Times Daily Patient not taking: Reported on 10/21/2023    [provider]  methocarbamol (ROBAXIN) 500 MG tablet Take 1 tablet (500 mg total) by mouth every 8 (eight) hours as needed for muscle spasms. Patient not taking: Reported on 10/21/2023 08/23/23   Long, Arlyss Repress, MD  oxyCODONE-acetaminophen (PERCOCET/ROXICET) 5-325 MG tablet Take 1 tablet by mouth every 6 (six) hours as needed for severe pain. Patient not taking: Reported on 10/21/2023 08/23/23   Long, Arlyss Repress, MD  senna-docusate (SENOKOT-S) 8.6-50 MG tablet Take 1 tablet by mouth at bedtime as needed for mild constipation. Patient not taking: Reported on 10/21/2023 08/23/23   Maia Plan, MD    Physical Exam: Vitals:   10/21/23 1600 10/21/23 1615 10/21/23 1630 10/21/23 1645  BP: 101/60 (!) 108/51 90/61 107/61  Pulse: (!) 107 (!) 108 (!) 102 (!) 109  Resp: 19 18 16  (!) 23  Temp:      TempSrc:      SpO2: 100% 100% 100% 100%  Weight:      Height:        Constitutional: NAD, calm, comfortable Vitals:   10/21/23 1600 10/21/23 1615 10/21/23 1630 10/21/23 1645  BP: 101/60 (!) 108/51 90/61 107/61  Pulse: (!) 107 (!) 108 (!) 102 (!) 109  Resp: 19 18 16  (!) 23  Temp:      TempSrc:      SpO2: 100% 100% 100% 100%  Weight:      Height:       Eyes: PERRL, lids and conjunctivae normal ENMT: Mucous membranes are dry  Neck: normal, supple, no masses, no thyromegaly Respiratory: clear to auscultation bilaterally, no wheezing, no crackles. Normal respiratory effort. No accessory muscle use.  Cardiovascular: Regular rate and rhythm, no murmurs / rubs / gallops. No extremity edema. 2+ pedal pulses Abdomen: epigastric tenderness, no masses palpated. No hepatosplenomegaly. Bowel sounds positive.  Musculoskeletal: no clubbing / cyanosis. No joint deformity upper and lower extremities. Good ROM, no contractures. Normal muscle tone.  Skin: no  rashes, lesions, ulcers. No induration Neurologic: CN 2-12 grossly intact. Sensation intact, Strength 5/5 in all 4.  Psychiatric: Normal judgment and insight. Alert and oriented x 3. Normal mood.    Labs on Admission: I have personally reviewed following labs and imaging studies  CBC: Recent Labs  Lab 10/21/23 0846 10/21/23 1000  WBC 14.3*  --   NEUTROABS 11.0*  --   HGB 7.0* 6.8*  HCT 20.6* 20.0*  MCV 99.5  --   PLT 260  --    Basic Metabolic Panel: Recent Labs  Lab 10/21/23 0846 10/21/23 1000  NA 135 137  K 2.8* 2.8*  CL 100 99  CO2 23  --   GLUCOSE 131* 129*  BUN 52* 51*  CREATININE 1.70* 1.70*  CALCIUM 9.2  --    GFR: Estimated Creatinine Clearance: 41.7 mL/min (A) (by C-G formula based on SCr of 1.7 mg/dL (H)). Liver Function Tests: Recent Labs  Lab 10/21/23 0846  AST 18  ALT 16  ALKPHOS 50  BILITOT 0.5  PROT 6.0*  ALBUMIN 3.3*   Recent Labs  Lab 10/21/23 0846  LIPASE 33   No results for input(s): "AMMONIA" in the last 168 hours. Coagulation Profile: Recent Labs  Lab 10/21/23 0846  INR 1.0   Cardiac Enzymes: No results for input(s): "CKTOTAL", "CKMB", "CKMBINDEX", "TROPONINI" in the last 168 hours. BNP (last 3 results) No results for input(s): "PROBNP" in the last 8760 hours. HbA1C: No results for input(s): "HGBA1C" in the last 72 hours. CBG: No results for input(s): "GLUCAP" in the last 168 hours. Lipid Profile: No results for input(s): "CHOL", "HDL", "LDLCALC", "TRIG", "CHOLHDL", "LDLDIRECT" in the last 72 hours. Thyroid Function Tests: No results for input(s): "TSH", "T4TOTAL", "FREET4", "T3FREE", "THYROIDAB" in the last 72 hours. Anemia Panel: No results for input(s): "VITAMINB12", "FOLATE", "FERRITIN", "TIBC", "IRON", "RETICCTPCT" in the last 72 hours. Urine analysis:    Component Value Date/Time   COLORURINE YELLOW 12/24/2022 1622   APPEARANCEUR CLEAR 12/24/2022 1622   LABSPEC 1.010 12/24/2022 1622   PHURINE 6.0 12/24/2022 1622    GLUCOSEU NEGATIVE 12/24/2022 1622   HGBUR NEGATIVE 12/24/2022 1622   BILIRUBINUR NEGATIVE 12/24/2022 1622   KETONESUR NEGATIVE 12/24/2022 1622   UROBILINOGEN 0.2 12/24/2022 1622   NITRITE NEGATIVE 12/24/2022 1622   LEUKOCYTESUR NEGATIVE 12/24/2022 1622    Radiological Exams on Admission: No results found.  EKG: Independently reviewed. N/a  Assessment/Plan   Upper GI bleed  Symptomatic anemia of blood loss -hx nsaid use as well as etoh - admit to progressive care  - s/p 2 unit prbc in ed -continue on ppi iv bid  - npo  - per gi rec plan for edg in am  - continue with ivfs  - check iron stores   Hypokalemia -replete prn  -check magnesium   AKI -in setting of volume loss - continue with resuscitation  - monitor labs  - avoid nephrotoxic medications  ETOH abuse -states last drink was one week ago -place on ciwa surveillance   Anxiety Depression -resume regimen as able     HLD -resume statin once taking po   HTN -currently hypotensive  -hold medications currently     DVT prophylaxis: scd Code Status: full/ as discussed per patient wishes in event of cardiac arrest  Family Communication:  none at bedside  Disposition Plan: patient  expected to be admitted greater than 2 midnights  Consults called: gi  Admission status: progressive care   Lurline Del MD [facility]   If 7PM-7AM, please contact night-coverage [email or web address] Password [facility]  10/21/2023, 5:53 PM

## 2023-10-21 NOTE — Anesthesia Preprocedure Evaluation (Signed)
Anesthesia Evaluation  Patient identified by MRN, date of birth, ID band Patient awake    Reviewed: Allergy & Precautions, NPO status , Patient's Chart, lab work & pertinent test results  Airway Mallampati: II       Dental  (+) Poor Dentition, Missing, Dental Advisory Given   Pulmonary Patient abstained from smoking., former smoker Vapes- last more than 24 hours   Pulmonary exam normal breath sounds clear to auscultation       Cardiovascular hypertension, Pt. on medications Normal cardiovascular exam Rhythm:Regular Rate:Normal     Neuro/Psych  PSYCHIATRIC DISORDERS Anxiety Depression    negative neurological ROS     GI/Hepatic ,,,(+)     substance abuse  alcohol useETOH abuse in remission Melena Hx/o NSAID use   Endo/Other  Hyperlipidemia   Renal/GU negative Renal ROS  negative genitourinary   Musculoskeletal  (+) Arthritis , Osteoarthritis,  S/P left ankle Fx hardware   Abdominal   Peds  Hematology  (+) Blood dyscrasia, anemia   Anesthesia Other Findings   Reproductive/Obstetrics ED                              Anesthesia Physical Anesthesia Plan  ASA: 3  Anesthesia Plan: MAC   Post-op Pain Management: Minimal or no pain anticipated   Induction: Intravenous  PONV Risk Score and Plan: 1 and Propofol infusion and Treatment may vary due to age or medical condition  Airway Management Planned: Natural Airway and Nasal Cannula  Additional Equipment: None  Intra-op Plan:   Post-operative Plan:   Informed Consent: I have reviewed the patients History and Physical, chart, labs and discussed the procedure including the risks, benefits and alternatives for the proposed anesthesia with the patient or authorized representative who has indicated his/her understanding and acceptance.     Dental advisory given  Plan Discussed with: CRNA and Anesthesiologist  Anesthesia Plan  Comments:          Anesthesia Quick Evaluation

## 2023-10-21 NOTE — H&P (View-Only) (Signed)
Consultation  Referring Provider: Dr. Rhoderick Moody    Primary Care Physician:  Etta Grandchild, MD Primary Gastroenterologist: Dr. Myrtie Neither       Reason for Consultation: Coffee-ground emesis         HPI:   Collin Horne is a 57 y.o. male with history as listed below including depression and alcohol use who presented to the ER after developing dark vomit.    Today, the patient describes that he was a heavy drinker for years but has not drank heavily in the past 3 years.  In fact his last alcoholic drink was a week ago.  He no longer drinks on a regular basis.  He does tell me though that about 2 months ago now he had a broken ankle which had to be repaired and over this time.  He has been using a lot of pain medications including NSAIDs, sometimes multiple a day recently.  Describes that on 10/15/2023 he started feeling just "bad", and was able to get to sleep but woke up the next morning vomiting.  He continuously vomited for the last week with inability to really eat or drink anything and then this morning around 7 AM started with coffee-ground emesis multiple times.  Tells me he started noticing black tarry stools on Saturday, 10/19/2023.  Along with this is experiencing some epigastric discomfort, but thinks this is from all of his vomiting.    Denies fever, chills or weight loss.  ER Course: Hemoglobin 6.8 (10.8 on 04/04/2023), potassium 2.8, BUN 51, creatinine 1.7, alcohol level less than 10, lipase normal, INR normal, 1 unit PRBCs ordered  GI history: 03/18/2023 colonoscopy with 3 diminutive polyps in the descending and ascending colon, internal hemorrhoids and otherwise normal; pathology showed adenomas and repeat recommended in 5 years  Past Medical History:  Diagnosis Date  . Anemia   . Anxiety   . Depression   . Hyperlipidemia   . Hypertension   . Substance abuse [facility])     Past Surgical History:  Procedure Laterality Date  . ANKLE SURGERY  11/12/2005  . COLONOSCOPY    .  SIGMOIDOSCOPY      Family History  Problem Relation Age of Onset  . Arthritis Mother   . Alcohol abuse Father   . Depression Father   . Early death Father   . Depression Sister   . Colon polyps Maternal Grandfather   . Heart disease Neg Hx   . Hyperlipidemia Neg Hx   . Hypertension Neg Hx   . Kidney disease Neg Hx   . Stroke Neg Hx   . Asthma Neg Hx   . Colon cancer Neg Hx   . Esophageal cancer Neg Hx   . Rectal cancer Neg Hx   . Stomach cancer Neg Hx     Social History   Tobacco Use  . Smoking status: Former    Current packs/day: 0.00    Average packs/day: 1 pack/day for 30.0 years (30.0 ttl pk-yrs)    Types: Cigarettes, E-cigarettes    Start date: 06/21/1990    Quit date: 06/21/2020    Years since quitting: 3.3  . Smokeless tobacco: Never  Vaping Use  . Vaping status: Every Day  Substance Use Topics  . Alcohol use: Yes    Comment: once a month  . Drug use: Never    Prior to Admission medications   Medication Sig Start Date End Date Taking? Authorizing Provider  atorvastatin (LIPITOR) 20 MG tablet TAKE 1 TABLET BY  MOUTH EVERY DAY 03/30/23   Etta Grandchild, MD  desvenlafaxine (PRISTIQ) 100 MG 24 hr tablet Take 100 mg by mouth daily.    [provider]  gabapentin (NEURONTIN) 600 MG tablet Take 800 mg by mouth 3 (three) times daily before meals.    [provider]  ketoconazole (NIZORAL) 2 % shampoo Apply 1 Application topically 2 (two) times a week. 12/24/22   Etta Grandchild, MD  lamoTRIgine (LAMICTAL) 150 MG tablet Take 100 mg by mouth daily.    [provider]  magnesium 30 MG tablet Take 30 mg by mouth 2 (two) times daily.    [provider]  methocarbamol (ROBAXIN) 500 MG tablet Take 1 tablet (500 mg total) by mouth every 8 (eight) hours as needed for muscle spasms. 08/23/23   Long, Arlyss Repress, MD  olmesartan (BENICAR) 20 MG tablet TAKE 1 TABLET BY MOUTH DAILY. FOLLOW-UP APPT DUE IN SEPT MUST SEE PROVIDER FOR FUTURE REFILLS  08/26/23   Etta Grandchild, MD  oxyCODONE-acetaminophen (PERCOCET/ROXICET) 5-325 MG tablet Take 1 tablet by mouth every 6 (six) hours as needed for severe pain. 08/23/23   Long, Arlyss Repress, MD  propranolol (INDERAL) 10 MG tablet Take by mouth. 12/31/22   [provider]  senna-docusate (SENOKOT-S) 8.6-50 MG tablet Take 1 tablet by mouth at bedtime as needed for mild constipation. 08/23/23   Long, Arlyss Repress, MD  sildenafil (VIAGRA) 100 MG tablet Take 1 tablet (100 mg total) by mouth daily as needed for erectile dysfunction. 08/07/22   Etta Grandchild, MD  thiamine (VITAMIN B-1) 50 MG tablet Take 1 tablet (50 mg total) by mouth daily. 04/08/23   Etta Grandchild, MD  traZODone (DESYREL) 50 MG tablet Take by mouth. 11/15/20   [provider]    Current Facility-Administered Medications  Medication Dose Route Frequency Provider Last Rate Last Admin  . 0.9 %  sodium chloride infusion (Manually program via Guardrails IV Fluids)   Intravenous Once Lonell Grandchild, MD      . ondansetron [facility]) injection 4 mg  4 mg Intravenous Once Lonell Grandchild, MD      . pantoprazole (PROTONIX) injection 80 mg  80 mg Intravenous Once Lonell Grandchild, MD       Current Outpatient Medications  Medication Sig Dispense Refill  . atorvastatin (LIPITOR) 20 MG tablet TAKE 1 TABLET BY MOUTH EVERY DAY 90 tablet 1  . desvenlafaxine (PRISTIQ) 100 MG 24 hr tablet Take 100 mg by mouth daily.    Marland Kitchen gabapentin (NEURONTIN) 600 MG tablet Take 800 mg by mouth 3 (three) times daily before meals.    Marland Kitchen ketoconazole (NIZORAL) 2 % shampoo Apply 1 Application topically 2 (two) times a week. 120 mL 1  . lamoTRIgine (LAMICTAL) 150 MG tablet Take 100 mg by mouth daily.    . magnesium 30 MG tablet Take 30 mg by mouth 2 (two) times daily.    . methocarbamol (ROBAXIN) 500 MG tablet Take 1 tablet (500 mg total) by mouth every 8 (eight) hours as needed for muscle spasms. 20 tablet 0  . olmesartan (BENICAR) 20 MG tablet TAKE  1 TABLET BY MOUTH DAILY. FOLLOW-UP APPT DUE IN SEPT MUST SEE PROVIDER FOR FUTURE REFILLS 90 tablet 0  . oxyCODONE-acetaminophen (PERCOCET/ROXICET) 5-325 MG tablet Take 1 tablet by mouth every 6 (six) hours as needed for severe pain. 15 tablet 0  . propranolol (INDERAL) 10 MG tablet Take by mouth.    . senna-docusate (SENOKOT-S) 8.6-50 MG  tablet Take 1 tablet by mouth at bedtime as needed for mild constipation. 20 tablet 0  . sildenafil (VIAGRA) 100 MG tablet Take 1 tablet (100 mg total) by mouth daily as needed for erectile dysfunction. 30 tablet 0  . thiamine (VITAMIN B-1) 50 MG tablet Take 1 tablet (50 mg total) by mouth daily. 90 tablet 1  . traZODone (DESYREL) 50 MG tablet Take by mouth.      Allergies as of 10/21/2023  . (No Known Allergies)     Review of Systems:    Constitutional: No weight loss, fever or chills Skin: No rash  Cardiovascular: No chest pain Respiratory: No SOB  Gastrointestinal: See HPI and otherwise negative Genitourinary: No dysuria  Neurological: No headache, dizziness or syncope Musculoskeletal: No new muscle or joint pain Hematologic: No bruising Psychiatric: No history of depression or anxiety    Physical Exam:  Vital signs in last 24 hours: Temp:  [98.6 F (37 C)] 98.6 F (37 C) (12/09 0842) Pulse Rate:  [92-120] 110 (12/09 0950) Resp:  [16-26] 23 (12/09 0950) BP: (87-145)/(35-67) 145/35 (12/09 0950) SpO2:  [98 %-100 %] 98 % (12/09 0950) Weight:  [68 kg] 68 kg (12/09 0842)   General:   Pleasant Caucasian male appears to be in NAD, Well developed, Well nourished, alert and cooperative + visible dark brown stains from vomiting on his again Head:  Normocephalic and atraumatic. Eyes:   PEERL, EOMI. No icterus. Conjunctiva pink. Ears:  Normal auditory acuity. Neck:  Supple Throat: Oral cavity and pharynx without inflammation, swelling or lesion. Teeth in good condition. Lungs: Respirations even and unlabored. Lungs clear to auscultation bilaterally.    No wheezes, crackles, or rhonchi.  Heart: Normal S1, S2. No MRG. Regular rate and rhythm. No peripheral edema, cyanosis or pallor.  Abdomen:  Soft, nondistended, mild epigastric TTP, no rebound or guarding. Normal bowel sounds. No appreciable masses or hepatomegaly. Rectal:  Not performed.  Msk:  Symmetrical without gross deformities. Peripheral pulses intact.  Extremities:  Without edema, no deformity or joint abnormality. Normal ROM, normal sensation. Neurologic:  Alert and  oriented x4;  grossly normal neurologically.  Skin:   Dry and intact without significant lesions or rashes. Psychiatric: Demonstrates good judgement and reason without abnormal affect or behaviors.   LAB RESULTS: Recent Labs    10/21/23 0846 10/21/23 1000  WBC 14.3*  --   HGB 7.0* 6.8*  HCT 20.6* 20.0*  PLT 260  --    BMET Recent Labs    10/21/23 1000  NA 137  K 2.8*  CL 99  GLUCOSE 129*  BUN 51*  CREATININE 1.70*      Latest Ref Rng & Units 10/21/2023    8:46 AM 10/24/2022    7:57 AM 12/21/2021    4:00 PM  Hepatic Function  Total Protein 6.5 - 8.1 g/dL 6.0  7.9  7.5   Albumin 3.5 - 5.0 g/dL 3.3  4.8  4.3   AST 15 - 41 U/L 18  18  14    ALT 0 - 44 U/L 16  15  11    Alk Phosphatase 38 - 126 U/L 50  66  61   Total Bilirubin <1.2 mg/dL 0.5  0.6  0.4   Bilirubin, Direct 0.0 - 0.3 mg/dL   0.1     PT/INR Recent Labs    10/21/23 0846  LABPROT 13.7  INR 1.0     Impression / Plan:   Impression: 1.  Acute blood loss anemia: Hemoglobin 10.86 months  ago--> 7.0--> 6.8, BUN elevated at 51 today coffee-ground emesis started this morning, melena started 48 hours ago, history of heavy NSAID use after recent ankle fracture; likely NSAID related ulcer +/- gastritis +/- esophagitis 2.  Hematemesis 3.  History of alcohol abuse: Patient was a heavy alcohol drinker until 3 years ago, now only occasionally with a last drink a week ago per him 4.  Recent ankle fracture: With NSAID use likely contributory 5.   Hypokalemia: Likely due to recent vomiting  Plan: 1.  Patient will need hemoglobin above 7 and a normal potassium prior to proceeding with EGD.  Due to low potassium we will wait until tomorrow when he is resuscitated for EGD.  Please let us know if any of his symptoms worsen today requiring more urgent EGD.  Did discuss risk and benefits, notations and alternatives of procedure with the patient he agrees to proceed. 2.  N.p.o. for now, pending course of day could have clears until midnight. 3.  Continue to monitor hemoglobin  with transfusion as needed less than 7 4.  Appreciate hospitalist assistance with hypokalemia, potassium is already been ordered 5.  Agree with Pantoprazole 40 mg IV twice daily 6.  Recommend the patient avoid NSAIDs in the future  Thank you for your kind consultation, we will continue to follow.  Violet Baldy Lemmon  10/21/2023, 10:16 AM  I have taken an interval history, thoroughly reviewed the chart and examined the patient. I agree with the Advanced Practitioner's note, impression and recommendations, and have recorded additional findings, impressions and recommendations below. I performed a substantive portion of this encounter (>50% time spent), including a complete performance of the medical decision making.  My additional thoughts are as follows:  57 year old man with about 2 months of NSAID use since an ankle fracture, now with an upper GI bleed and marked acute blood loss anemia. He also has significant hypokalemia, perhaps from recent vomiting and decreased oral intake in the last week because he has not been feeling well. Blood pressure 90/50 right now with a pulse of 110.  However, he is mentating well, denies chest pain or dyspnea, no abdominal pain, cardiopulmonary and abdominal exam benign.  Our current plan is for an upper endoscopy on him tomorrow morning after resuscitation with PRBCs, fluids, potassium as well as Protonix. He needs 2 units of PRBCs  initially with a posttransfusion hemoglobin and hematocrit 2 to 3 hours afterward.  Additional PRBCs as needed.  Then Q 6 hr Hgb/Hct  Thus far he is only received 40 mEq of oral potassium for potassium of 2.8, he needs additional potassium ordered with another level checked this evening and tomorrow morning. I ordered potassium and labs and messaged his ED nurse since medicine service has not yet seen this patient.  In the event that he has continued hypotension and tachycardia despite volume resuscitation, particularly if his hemoglobin does not improve as expected later today and in the overnight hours, please contact our overnight coverage to reevaluate the possibility of a more urgent upper endoscopy.  This is all more reason he should remain n.p.o. for now and more potassium administered.   Charlie Pitter III Office:[phone]

## 2023-10-22 ENCOUNTER — Encounter (HOSPITAL_COMMUNITY): Payer: Self-pay | Admitting: Internal Medicine

## 2023-10-22 ENCOUNTER — Inpatient Hospital Stay (HOSPITAL_COMMUNITY): Payer: 59 | Admitting: Anesthesiology

## 2023-10-22 ENCOUNTER — Inpatient Hospital Stay (HOSPITAL_COMMUNITY): Payer: Self-pay | Admitting: Anesthesiology

## 2023-10-22 ENCOUNTER — Encounter (HOSPITAL_COMMUNITY)
Admission: EM | Disposition: A | Discharge status: Home / Self Care | Payer: Self-pay | Source: Home / Self Care | Attending: Internal Medicine

## 2023-10-22 DIAGNOSIS — K2970 Gastritis, unspecified, without bleeding: Secondary | ICD-10-CM

## 2023-10-22 DIAGNOSIS — K259 Gastric ulcer, unspecified as acute or chronic, without hemorrhage or perforation: Secondary | ICD-10-CM

## 2023-10-22 DIAGNOSIS — K279 Peptic ulcer, site unspecified, unspecified as acute or chronic, without hemorrhage or perforation: Secondary | ICD-10-CM

## 2023-10-22 DIAGNOSIS — K3189 Other diseases of stomach and duodenum: Secondary | ICD-10-CM

## 2023-10-22 DIAGNOSIS — K2100 Gastro-esophageal reflux disease with esophagitis, without bleeding: Secondary | ICD-10-CM

## 2023-10-22 DIAGNOSIS — K2210 Ulcer of esophagus without bleeding: Secondary | ICD-10-CM | POA: Diagnosis not present

## 2023-10-22 DIAGNOSIS — K2980 Duodenitis without bleeding: Secondary | ICD-10-CM

## 2023-10-22 DIAGNOSIS — K254 Chronic or unspecified gastric ulcer with hemorrhage: Secondary | ICD-10-CM

## 2023-10-22 DIAGNOSIS — K920 Hematemesis: Secondary | ICD-10-CM

## 2023-10-22 DIAGNOSIS — K921 Melena: Secondary | ICD-10-CM

## 2023-10-22 DIAGNOSIS — K922 Gastrointestinal hemorrhage, unspecified: Secondary | ICD-10-CM | POA: Diagnosis present

## 2023-10-22 DIAGNOSIS — D62 Acute posthemorrhagic anemia: Secondary | ICD-10-CM

## 2023-10-22 HISTORY — PX: BIOPSY: SHX5522

## 2023-10-22 HISTORY — PX: ESOPHAGOGASTRODUODENOSCOPY (EGD) WITH PROPOFOL: SHX5813

## 2023-10-22 LAB — CBC
HCT: 23 % — ABNORMAL LOW (ref 39.0–52.0)
Hemoglobin: 7.9 g/dL — ABNORMAL LOW (ref 13.0–17.0)
MCH: 32.6 pg (ref 26.0–34.0)
MCHC: 34.3 g/dL (ref 30.0–36.0)
MCV: 95 fL (ref 80.0–100.0)
Platelets: 179 10*3/uL (ref 150–400)
RBC: 2.42 MIL/uL — ABNORMAL LOW (ref 4.22–5.81)
RDW: 17.3 % — ABNORMAL HIGH (ref 11.5–15.5)
WBC: 7.1 10*3/uL (ref 4.0–10.5)
nRBC: 0 % (ref 0.0–0.2)

## 2023-10-22 LAB — TYPE AND SCREEN
ABO/RH(D): O NEG
Antibody Screen: NEGATIVE
Unit division: 0
Unit division: 0

## 2023-10-22 LAB — BPAM RBC
Blood Product Expiration Date: 202412172359
Blood Product Expiration Date: 202412172359
ISSUE DATE / TIME: 202412091145
ISSUE DATE / TIME: 202412091337
Unit Type and Rh: 9500
Unit Type and Rh: 9500

## 2023-10-22 LAB — COMPREHENSIVE METABOLIC PANEL
ALT: 21 U/L (ref 0–44)
AST: 32 U/L (ref 15–41)
Albumin: 3.1 g/dL — ABNORMAL LOW (ref 3.5–5.0)
Alkaline Phosphatase: 40 U/L (ref 38–126)
Anion gap: 7 (ref 5–15)
BUN: 25 mg/dL — ABNORMAL HIGH (ref 6–20)
CO2: 23 mmol/L (ref 22–32)
Calcium: 8.3 mg/dL — ABNORMAL LOW (ref 8.9–10.3)
Chloride: 106 mmol/L (ref 98–111)
Creatinine, Ser: 0.91 mg/dL (ref 0.61–1.24)
GFR, Estimated: >60 mL/min (ref >60)
Glucose, Bld: 111 mg/dL — ABNORMAL HIGH (ref 70–99)
Potassium: 4.3 mmol/L (ref 3.5–5.1)
Sodium: 136 mmol/L (ref 135–145)
Total Bilirubin: 0.9 mg/dL (ref <1.2)
Total Protein: 5.5 g/dL — ABNORMAL LOW (ref 6.5–8.1)

## 2023-10-22 LAB — HEMOGLOBIN AND HEMATOCRIT, BLOOD
HCT: 23.3 % — ABNORMAL LOW (ref 39.0–52.0)
Hemoglobin: 8 g/dL — ABNORMAL LOW (ref 13.0–17.0)

## 2023-10-22 LAB — HIV ANTIBODY (ROUTINE TESTING W REFLEX): HIV Screen 4th Generation wRfx: NONREACTIVE

## 2023-10-22 SURGERY — ESOPHAGOGASTRODUODENOSCOPY (EGD) WITH PROPOFOL
Anesthesia: Monitor Anesthesia Care

## 2023-10-22 MED ORDER — PHENYLEPHRINE HCL (PRESSORS) 10 MG/ML IV SOLN
INTRAVENOUS | Status: DC | PRN
  Administered 2023-10-22: 160 ug via INTRAVENOUS
  Administered 2023-10-22: 80 ug via INTRAVENOUS

## 2023-10-22 MED ORDER — PROPOFOL 10 MG/ML IV BOLUS
INTRAVENOUS | Status: DC | PRN
  Administered 2023-10-22: 100 mg via INTRAVENOUS
  Administered 2023-10-22: 20 mg via INTRAVENOUS
  Administered 2023-10-22: 50 mg via INTRAVENOUS
  Administered 2023-10-22: 100 mg via INTRAVENOUS

## 2023-10-22 MED ORDER — PROPOFOL 500 MG/50ML IV EMUL: INTRAVENOUS | Status: DC | PRN

## 2023-10-22 MED ORDER — ONDANSETRON HCL 4 MG/2ML IJ SOLN: 4.0000 mg | Freq: Four times a day (QID) | INTRAMUSCULAR | Status: DC | PRN

## 2023-10-22 MED ORDER — PANTOPRAZOLE SODIUM 40 MG PO TBEC
40.0000 mg | DELAYED_RELEASE_TABLET | Freq: Two times a day (BID) | ORAL | Status: DC
  Administered 2023-10-22 – 2023-10-23 (×2): 40 mg via ORAL
  Filled 2023-10-22 (×2): qty 1

## 2023-10-22 MED ORDER — SODIUM CHLORIDE 0.9 % IV SOLN
INTRAVENOUS | Status: DC
Start: 2023-10-22 — End: 2023-10-22

## 2023-10-22 MED ORDER — ONDANSETRON HCL 4 MG PO TABS: 4.0000 mg | ORAL_TABLET | Freq: Four times a day (QID) | ORAL | Status: DC | PRN

## 2023-10-22 MED ORDER — ALBUTEROL SULFATE (2.5 MG/3ML) 0.083% IN NEBU: 2.5000 mg | INHALATION_SOLUTION | RESPIRATORY_TRACT | Status: DC | PRN

## 2023-10-22 MED ORDER — SODIUM CHLORIDE 0.9 % IV SOLN: INTRAVENOUS | Status: DC

## 2023-10-22 MED ORDER — ATORVASTATIN CALCIUM 10 MG PO TABS
20.0000 mg | ORAL_TABLET | Freq: Every day | ORAL | Status: DC
  Administered 2023-10-23: 20 mg via ORAL
  Filled 2023-10-22: qty 2

## 2023-10-22 SURGICAL SUPPLY — 14 items

## 2023-10-22 NOTE — Transfer of Care (Signed)
Immediate Anesthesia Transfer of Care Note  Patient: Collin Horne  Procedure(s) Performed: ESOPHAGOGASTRODUODENOSCOPY (EGD) WITH PROPOFOL BIOPSY  Patient Location: Endoscopy Unit  Anesthesia Type:MAC  Level of Consciousness: awake, oriented, and patient cooperative  Airway & Oxygen Therapy: Patient Spontanous Breathing  Post-op Assessment: Report given to RN and Post -op Vital signs reviewed and stable  Post vital signs: Reviewed and stable  Last Vitals:  Vitals Value Taken Time  BP 92/56 0900  Temp    Pulse 78 0900  Resp 12 0900  SpO2 98 0900    Last Pain:  Vitals:   10/22/23 0737  TempSrc: Temporal  PainSc: 0-No pain         Complications: No notable events documented.

## 2023-10-22 NOTE — Progress Notes (Signed)
   10/22/23 1525  Mobility  Activity Ambulated independently in room  Level of Assistance Standby assist, set-up cues, supervision of patient - no hands on  Assistive Device Front wheel walker  Distance Ambulated (ft) 20 ft  Activity Response Tolerated well  Mobility Referral Yes  Mobility visit 1 Mobility  Mobility Specialist Start Time (ACUTE ONLY) 1512  Mobility Specialist Stop Time (ACUTE ONLY) 1525  Mobility Specialist Time Calculation (min) (ACUTE ONLY) 13 min   Mobility Specialist: Progress Note  Pt agreeable to mobility session - requested by RN - received in bed. Required SB using RW. Pt was asymptomatic throughout session. Pt stated ankle was okay and has been putting "more weight" on his ankle recently. PT with no nausea or vomiting and has been mobilizing in room. Pt encouraged to continue mobilizing and to sit in the chair during stay.  Returned to bed with all needs met - call bell within reach.    NWB on LLE via Dorcas Carrow, MD.   Barnie Mort, BS Mobility Specialist Please contact via SecureChat or Rehab office at [phone].

## 2023-10-22 NOTE — Anesthesia Postprocedure Evaluation (Signed)
Anesthesia Post Note  Patient: Collin Horne  Procedure(s) Performed: ESOPHAGOGASTRODUODENOSCOPY (EGD) WITH PROPOFOL BIOPSY     Patient location during evaluation: PACU Anesthesia Type: MAC Level of consciousness: awake and alert Pain management: pain level controlled Vital Signs Assessment: post-procedure vital signs reviewed and stable Respiratory status: spontaneous breathing, nonlabored ventilation and respiratory function stable Cardiovascular status: stable and blood pressure returned to baseline Postop Assessment: no apparent nausea or vomiting Anesthetic complications: no   No notable events documented.  Last Vitals:  Vitals:   10/22/23 0920 10/22/23 0930  BP: 107/60 107/64  Pulse: 85 83  Resp: 15 19  Temp:    SpO2: 94% 96%    Last Pain:  Vitals:   10/22/23 0930  TempSrc:   PainSc: 0-No pain                 [name] A.

## 2023-10-22 NOTE — Progress Notes (Signed)
PROGRESS NOTE    Collin Horne  XBJ:[id number] DOB: [birth year] DOA: 10/21/2023 PCP: Etta Grandchild, MD    Brief Narrative:  57 year old with history of anemia, anxiety, depression, hypertension, hyperlipidemia, ongoing alcohol use presents to the emergency room with weakness, coffee-ground emesis.  Nausea vomiting and diarrhea for 2 days.  Also noted dark stools for 2 days.  In the emergency room initial blood pressure 91/67.  Heart rate 111.  Hemoglobin 7 from recent hemoglobin of 10.8.  Potassium 2.8.  Subjective: Patient seen and examined.  Came back from procedure.  Denies any complaints.  Eating lunch.  He was wondering what to use for his left ankle pain, suggested to use Tylenol. Assessment & Plan:    Upper GI bleeding, acute blood loss anemia.  Symptomatic. Presented with hemoglobin 7, recent known hemoglobin of 11-12.  Hemoglobin dropped to 6.8-2 units of PRBC transfusion.  Hemoglobin 8 now.  Will continue close monitoring. Remains on PPI twice daily. Underwent upper GI endoscopy today, he was found to have esophageal ulcer with no bleeding and no stigmata of recent bleeding.  He was also found to have nonbleeding gastric ulcers.  Gastritis and duodenitis.  This was likely secondary to NSAIDs and aspirin. GI recommended twice daily PPI on discharge.  Hypokalemia: Persistent.  Replaced aggressively.  Improved.  AKI: In the setting of volume loss.  Resuscitated.  Improved.  Alcohol use: Last drink more than a week ago.  On CIWA protocol.  Stable now.  Anxiety and depression: Stable.  Will resume home medications.   DVT prophylaxis: SCDs   Code Status: Full code Family Communication: None at bedside Disposition Plan: Status is: Inpatient Remains inpatient appropriate because: Inpatient procedures planned     Consultants:  Gastroenterology  Procedures:  Upper GI endoscopy  Antimicrobials:  None     Objective: Vitals:   10/22/23 0902 10/22/23 0910 10/22/23  0920 10/22/23 0930  BP: (!) 92/46 (!) 99/48 107/60 107/64  Pulse: 95 90 85 83  Resp: (!) 23 12 15 19   Temp: 97.6 F (36.4 C)     TempSrc: Temporal     SpO2: 100% 96% 94% 96%  Weight:      Height:        Intake/Output Summary (Last 24 hours) at 10/22/2023 1234 Last data filed at 10/22/2023 0902 Gross per 24 hour  Intake 1955 ml  Output --  Net 1955 ml   Filed Weights   10/21/23 0842  Weight: 68 kg    Examination:  General: Looks comfortable. Cardiovascular: S1-S2 normal.  Regular rhythm. Respiratory: Bilateral clear.  No added sounds. Gastrointestinal: Soft.  Nontender.  Bowel sound present. Ext: No swelling or edema. Left ankle with surgical scar clean and intact.  Nontender.      Data Reviewed: I have personally reviewed following labs and imaging studies  CBC: Recent Labs  Lab 10/21/23 0846 10/21/23 1000 10/21/23 1930 10/22/23 0124  WBC 14.3*  --   --   --   NEUTROABS 11.0*  --   --   --   HGB 7.0* 6.8* 8.3* 8.0*  HCT 20.6* 20.0* 23.9* 23.3*  MCV 99.5  --   --   --   PLT 260  --   --   --    Basic Metabolic Panel: Recent Labs  Lab 10/21/23 0846 10/21/23 1000 10/21/23 1930  NA 135 137 137  K 2.8* 2.8* 2.9*  CL 100 99 107  CO2 23  --  23  GLUCOSE 131* 129* 102*  BUN 52* 51* 37*  CREATININE 1.70* 1.70* 1.16  CALCIUM 9.2  --  8.4*  MG  --   --  1.7   GFR: Estimated Creatinine Clearance: 61.1 mL/min (by C-G formula based on SCr of 1.16 mg/dL). Liver Function Tests: Recent Labs  Lab 10/21/23 0846  AST 18  ALT 16  ALKPHOS 50  BILITOT 0.5  PROT 6.0*  ALBUMIN 3.3*   Recent Labs  Lab 10/21/23 0846  LIPASE 33   No results for input(s): "AMMONIA" in the last 168 hours. Coagulation Profile: Recent Labs  Lab 10/21/23 0846  INR 1.0   Cardiac Enzymes: No results for input(s): "CKTOTAL", "CKMB", "CKMBINDEX", "TROPONINI" in the last 168 hours. BNP (last 3 results) No results for input(s): "PROBNP" in the last 8760 hours. HbA1C: No  results for input(s): "HGBA1C" in the last 72 hours. CBG: No results for input(s): "GLUCAP" in the last 168 hours. Lipid Profile: No results for input(s): "CHOL", "HDL", "LDLCALC", "TRIG", "CHOLHDL", "LDLDIRECT" in the last 72 hours. Thyroid Function Tests: No results for input(s): "TSH", "T4TOTAL", "FREET4", "T3FREE", "THYROIDAB" in the last 72 hours. Anemia Panel: Recent Labs    10/21/23 1930  TIBC 302  IRON 253*   Sepsis Labs: No results for input(s): "PROCALCITON", "LATICACIDVEN" in the last 168 hours.  No results found for this or any previous visit (from the past 240 hour(s)).       Radiology Studies: No results found.      Scheduled Meds:  atorvastatin  20 mg Oral Daily   nicotine  21 mg Transdermal Daily   pantoprazole  40 mg Oral BID AC   Continuous Infusions:  sodium chloride 40 mL/hr at 10/22/23 0347     LOS: 1 day    Time spent: 35 minutes    Dorcas Carrow, MD [facility]

## 2023-10-22 NOTE — ED Notes (Signed)
IV occluded when flushing. RN attempted IV access x 2. IV team consult placed.

## 2023-10-22 NOTE — Plan of Care (Signed)

## 2023-10-22 NOTE — Interval H&P Note (Signed)
History and Physical Interval Note:  10/22/2023 8:04 AM  Collin Horne  has presented today for surgery, with the diagnosis of Melena, Anemia.  The various methods of treatment have been discussed with the patient and family. After consideration of risks, benefits and other options for treatment, the patient has consented to  Procedure(s): ESOPHAGOGASTRODUODENOSCOPY (EGD) WITH PROPOFOL (N/A) as a surgical intervention.  The patient's history has been reviewed, patient examined, no change in status, stable for surgery.  I have reviewed the patient's chart and labs.  Questions were answered to the patient's satisfaction.    Collin Horne was seen in the endoscopy preprocedure area.  He had no reported clinical events overnight, and says he has not had a BM or passed any blood per rectum since I saw him in the ED yesterday.  No further hematemesis.  He currently denies chest pain dyspnea or abdominal pain. His hemoglobin was 8.3 last evening and 8.0 at 1 AM.  His potassium was 2.9 last evening, and he does not appear to have received any further potassium supplementation afterward.  Morning labs are still pending. I spoke with Dr. Malen Gauze of anesthesia regarding the potassium level, and he felt we were okay to proceed with the upper endoscopy without further preprocedure labs.  Collin Horne

## 2023-10-22 NOTE — Op Note (Signed)
[facility] Patient Name: Collin Horne Procedure Date : 10/22/2023 MRN: [id number] Attending MD: Starr Lake. Myrtie Horne , MD, [id number] Date of Birth: 1966-09-07 CSN: [id number] Age: 57 Admit Type: Outpatient Procedure:                Upper GI endoscopy Indications:              Acute post hemorrhagic anemia, Coffee-ground                            emesis, Melena                           Clinical details in consult note 10/21/2023 Providers:                Sherilyn Cooter L. Myrtie Neither, MD, Suzy Bouchard, RN, Rozetta Nunnery, Technician Referring MD:             [facility] Medicines:                Monitored Anesthesia Care Complications:            No immediate complications. Estimated Blood Loss:     Estimated blood loss was minimal. Procedure:                Pre-Anesthesia Assessment:                           - Prior to the procedure, a History and Physical                            was performed, and patient medications and                            allergies were reviewed. The patient's tolerance of                            previous anesthesia was also reviewed. The risks                            and benefits of the procedure and the sedation                            options and risks were discussed with the patient.                            All questions were answered, and informed consent                            was obtained. Prior Anticoagulants: The patient has                            taken no anticoagulant or antiplatelet agents. ASA  Grade Assessment: III - A patient with severe                            systemic disease. After reviewing the risks and                            benefits, the patient was deemed in satisfactory                            condition to undergo the procedure.                           After obtaining informed consent, the endoscope was                            passed  under direct vision. Throughout the                            procedure, the patient's blood pressure, pulse, and                            oxygen saturations were monitored continuously. The                            [id number]-H190 ([id number] Olympus endoscope was introduced                            through the mouth, and advanced to the second part                            of duodenum. The upper GI endoscopy was                            accomplished without difficulty. The patient                            tolerated the procedure well. Scope In: Scope Out: Findings:      The larynx was normal.      One superficial esophageal ulcer with no bleeding and no stigmata of       recent bleeding was found at the gastroesophageal junction. The lesion       was 10 mm in largest dimension. There was mild distal reflux esophagitis       as well.      One non-bleeding cratered gastric ulcer with no stigmata of bleeding was       found in the prepyloric region of the stomach. The lesion was 6 mm in       largest dimension. Benign appearing, mild mucosal friability and       granularity at ulcer edges. Biopsies were taken from the ulcer edge with       a cold forceps for histology. (Jar 2)      Diffuse inflammation characterized by erosions and erythema was found in       the gastric body and in the gastric antrum. Biopsies were taken with a  cold forceps for histology. (Antrum and body-jar 1. Rule out H. pylori)      The exam of the stomach was otherwise normal. (Including on retroflexion)      Mild inflammation characterized by erosions and erythema was found in       the duodenal bulb.      The exam of the duodenum was otherwise normal. Impression:               - Normal larynx.                           - Esophageal ulcer with no bleeding and no stigmata                            of recent bleeding.                           - Non-bleeding gastric ulcer with no stigmata of                             bleeding. Biopsied.                           - Gastritis. Biopsied.                           - Duodenitis. Recommendation:           - Return patient to hospital ward for ongoing care.                           - Resume regular diet.                           - Stop use of all aspirin and NSAID containing                            products                           Pantoprazole 40 mg by mouth twice daily for 8 weeks                           Biopsies will be followed up in the outpatient                            setting, and then clinic, lab and endoscopic                            follow-up will be arranged.                           This patient would benefit from a dose of IV iron                            prior to discharge.  At the time of discharge, he needs to be on iron                            sulfate 325 mg tablets, 1 tablet twice daily.                           This patient needs further potassium repletion.                           If his hemoglobin is stable on a check later today,                            and his potassium is sufficiently repleted from the                            standpoint of the internal medicine service, and                            patient is tolerating regular food well, he could                            potentially be discharged home from a GI                            perspective. Procedure Code(s):        --- Professional ---                           [phone], Esophagogastroduodenoscopy, flexible,                            transoral; with biopsy, single or multiple Diagnosis Code(s):        --- Professional ---                           K22.10, Ulcer of esophagus without bleeding                           K25.9, Gastric ulcer, unspecified as acute or                            chronic, without hemorrhage or perforation                           K29.70, Gastritis, unspecified, without  bleeding                           K29.80, Duodenitis without bleeding                           D62, Acute posthemorrhagic anemia                           K92.0, Hematemesis  K92.1, Melena (includes Hematochezia) CPT copyright 2022 [employer]. All rights reserved. The codes documented in this report are preliminary and upon coder review may  be revised to meet current compliance requirements. Collin Janelle L. Myrtie Neither, MD 10/22/2023 9:08:31 AM This report has been signed electronically. Number of Addenda: 0

## 2023-10-23 ENCOUNTER — Ambulatory Visit: Payer: 59

## 2023-10-23 DIAGNOSIS — K920 Hematemesis: Secondary | ICD-10-CM | POA: Diagnosis not present

## 2023-10-23 DIAGNOSIS — D62 Acute posthemorrhagic anemia: Secondary | ICD-10-CM | POA: Diagnosis not present

## 2023-10-23 DIAGNOSIS — K259 Gastric ulcer, unspecified as acute or chronic, without hemorrhage or perforation: Secondary | ICD-10-CM | POA: Diagnosis not present

## 2023-10-23 LAB — CBC WITH DIFFERENTIAL/PLATELET
Abs Immature Granulocytes: 0.04 10*3/uL (ref 0.00–0.07)
Basophils Absolute: 0.1 10*3/uL (ref 0.0–0.1)
Basophils Relative: 1 %
Eosinophils Absolute: 0.3 10*3/uL (ref 0.0–0.5)
Eosinophils Relative: 4 %
HCT: 20.7 % — ABNORMAL LOW (ref 39.0–52.0)
Hemoglobin: 7.1 g/dL — ABNORMAL LOW (ref 13.0–17.0)
Immature Granulocytes: 1 %
Lymphocytes Relative: 26 %
Lymphs Abs: 2 10*3/uL (ref 0.7–4.0)
MCH: 32.7 pg (ref 26.0–34.0)
MCHC: 34.3 g/dL (ref 30.0–36.0)
MCV: 95.4 fL (ref 80.0–100.0)
Monocytes Absolute: 0.8 10*3/uL (ref 0.1–1.0)
Monocytes Relative: 10 %
Neutro Abs: 4.5 10*3/uL (ref 1.7–7.7)
Neutrophils Relative %: 58 %
Platelets: 179 10*3/uL (ref 150–400)
RBC: 2.17 MIL/uL — ABNORMAL LOW (ref 4.22–5.81)
RDW: 17.2 % — ABNORMAL HIGH (ref 11.5–15.5)
WBC: 7.8 10*3/uL (ref 4.0–10.5)
nRBC: 0 % (ref 0.0–0.2)

## 2023-10-23 LAB — SURGICAL PATHOLOGY

## 2023-10-23 MED ORDER — SODIUM CHLORIDE 0.9 % IV SOLN
300.0000 mg | Freq: Once | INTRAVENOUS | Status: AC
  Administered 2023-10-23: 300 mg via INTRAVENOUS
  Filled 2023-10-23: qty 15

## 2023-10-23 MED ORDER — PANTOPRAZOLE SODIUM 40 MG PO TBEC: 40.0000 mg | DELAYED_RELEASE_TABLET | Freq: Two times a day (BID) | ORAL | 0 refills | Status: DC

## 2023-10-23 MED ORDER — IRON (FERROUS SULFATE) 325 (65 FE) MG PO TABS: 1.0000 | ORAL_TABLET | Freq: Two times a day (BID) | ORAL | 0 refills | Status: DC

## 2023-10-23 NOTE — Discharge Summary (Signed)
Physician Discharge Summary  Collin Horne WUJ:[id number] DOB: 09-07-1966 DOA: 10/21/2023  PCP: Etta Grandchild, MD  Admit date: 10/21/2023 Discharge date: 10/23/2023  Admitted From: Home Disposition: Home  Recommendations for Outpatient Follow-up:  Follow up with PCP in 1-2 weeks Please obtain BMP/CBC in one week Follow-up with orthopedics  Discharge Condition: Stable CODE STATUS: Full code Diet recommendation: Regular diet  Discharge summary: 57 year old with history of chronic anemia, anxiety, depression, hypertension, hyperlipidemia, ongoing alcohol use presents to the emergency room with weakness, coffee-ground emesis. Nausea vomiting and diarrhea for 2 days. Also noted dark stools for 2 days. In the emergency room initial blood pressure 91/67. Heart rate 111. Hemoglobin 7 from recent hemoglobin of 10.8. Potassium 2.8.  Patient recently had left ankle fracture and he has been using NSAIDs frequently.  # Upper GI bleeding, acute blood loss anemia.  Symptomatic. Presented with hemoglobin 7, recent known hemoglobin of 11-12.  Hemoglobin dropped to 6.8-2 units of PRBC transfusion.  Hemoglobin is stabilized to 8-7.1.  No evidence of ongoing bleeding.  Underwent upper GI endoscopy , he was found to have esophageal ulcer with no bleeding and no stigmata of recent bleeding.  He was also found to have nonbleeding gastric ulcers.  Gastritis and duodenitis.  This was likely secondary to NSAIDs and aspirin. As per GI recommendation, he will be discharged on PPI twice daily.  Avoid NSAIDs.  Use Tylenol for musculoskeletal pain. Patient was given 1 dose of IV sucrose 300 mg before discharge.  Also discharged on oral iron supplementation.   Hypokalemia: Replaced aggressively.  Normalized.   AKI: In the setting of volume loss.  Resuscitated.  Improved.   Alcohol use: Last drink more than a week ago.  He is motivated to quit.  Did not have any evidence of withdrawals.   Anxiety and depression:  Stable.  Will resume home medications.  Stable for discharge.      Discharge Diagnoses:  Principal Problem:   Hematemesis with nausea Active Problems:   ABLA (acute blood loss anemia)   Melena   Ulcer of esophagus without bleeding   Gastroesophageal reflux disease with esophagitis without hemorrhage   Chronic gastric ulcer with hemorrhage   Acute upper GI bleeding    Discharge Instructions  Discharge Instructions     Diet - low sodium heart healthy   Complete by: As directed    Discharge instructions   Complete by: As directed    Take your blood pressure medications starting 12/13   Increase activity slowly   Complete by: As directed       Allergies as of 10/23/2023   No Known Allergies      Medication List     STOP taking these medications    methocarbamol 500 MG tablet Commonly known as: ROBAXIN   oxyCODONE-acetaminophen 5-325 MG tablet Commonly known as: PERCOCET/ROXICET   senna-docusate 8.6-50 MG tablet Commonly known as: Senokot-S       TAKE these medications    atorvastatin 20 MG tablet Commonly known as: LIPITOR TAKE 1 TABLET BY MOUTH EVERY DAY   desvenlafaxine 100 MG 24 hr tablet Commonly known as: PRISTIQ Take 100 mg by mouth daily.   diphenhydrAMINE 25 mg capsule Commonly known as: BENADRYL Take 25-75 mg by mouth at bedtime as needed for sleep.   gabapentin 800 MG tablet Commonly known as: NEURONTIN Take 800 mg by mouth 3 (three) times daily as needed (nerve pain).   HYDROcodone-acetaminophen 5-325 MG tablet Commonly known as: NORCO/VICODIN SMARTSIG:1.0 Tablet(s) By Mouth  Every 6 Hours PRN   hydrOXYzine 25 MG capsule Commonly known as: VISTARIL SMARTSIG:1.0 Capsule(s) By Mouth 3 Times Daily   Iron (Ferrous Sulfate) 325 (65 Fe) MG Tabs Take 1 tablet by mouth 2 (two) times daily.   lamoTRIgine 100 MG tablet Commonly known as: LAMICTAL Take 100 mg by mouth daily.   multivitamin with minerals tablet Take 1 tablet by mouth  daily.   olmesartan 20 MG tablet Commonly known as: BENICAR TAKE 1 TABLET BY MOUTH DAILY. FOLLOW-UP APPT DUE IN SEPT MUST SEE PROVIDER FOR FUTURE REFILLS What changed: See the new instructions.   pantoprazole 40 MG tablet Commonly known as: PROTONIX Take 1 tablet (40 mg total) by mouth 2 (two) times daily before a meal.   propranolol 10 MG tablet Commonly known as: INDERAL Take 10 mg by mouth as needed (anxiety).   sildenafil 100 MG tablet Commonly known as: Viagra Take 1 tablet (100 mg total) by mouth daily as needed for erectile dysfunction. What changed: how much to take   traZODone 50 MG tablet Commonly known as: DESYREL Take 50-100 mg by mouth at bedtime as needed for sleep.   Vitamin D (Ergocalciferol) 1.25 MG (50000 UNIT) Caps capsule Commonly known as: DRISDOL Take 1 capsule by mouth once a week.        No Known Allergies  Consultations: Gastroenterology   Procedures/Studies: No results found. (Echo, Carotid, EGD, Colonoscopy, ERCP)    Subjective: Patient seen in the morning rounds.  Eager to go home.  Denies any complaints.  Tolerating regular diet. He was wondering about narcotic prescription for his left ankle, his surgery is more than 2 months ago.  I advised not to use narcotics for 46-month-old fracture.  He is seeing his orthopedics tomorrow.  He is nonweightbearing until then.   Discharge Exam: Vitals:   10/23/23 0409 10/23/23 0746  BP: 101/64 115/64  Pulse: 79 79  Resp: 17 18  Temp: 97.9 F (36.6 C) 98.4 F (36.9 C)  SpO2: 99% 100%   Vitals:   10/23/23 0010 10/23/23 0409 10/23/23 0412 10/23/23 0746  BP: (!) 106/55 101/64  115/64  Pulse: 78 79  79  Resp: 18 17  18   Temp: 98.4 F (36.9 C) 97.9 F (36.6 C)  98.4 F (36.9 C)  TempSrc: Axillary Axillary  Oral  SpO2: 99% 99%  100%  Weight:   68 kg   Height:        General: Pt is alert, awake, not in acute distress Cardiovascular: RRR, S1/S2 +, no rubs, no gallops Respiratory: CTA  bilaterally, no wheezing, no rhonchi Abdominal: Soft, NT, ND, bowel sounds + Extremities: no edema, no cyanosis    The results of significant diagnostics from this hospitalization (including imaging, microbiology, ancillary and laboratory) are listed below for reference.     Microbiology: No results found for this or any previous visit (from the past 240 hour(s)).   Labs: BNP (last 3 results) No results for input(s): "BNP" in the last 8760 hours. Basic Metabolic Panel: Recent Labs  Lab 10/21/23 0846 10/21/23 1000 10/21/23 1930 10/22/23 1228  NA 135 137 137 136  K 2.8* 2.8* 2.9* 4.3  CL 100 99 107 106  CO2 23  --  23 23  GLUCOSE 131* 129* 102* 111*  BUN 52* 51* 37* 25*  CREATININE 1.70* 1.70* 1.16 0.91  CALCIUM 9.2  --  8.4* 8.3*  MG  --   --  1.7  --    Liver Function Tests: Recent Labs  Lab 10/21/23 0846 10/22/23 1228  AST 18 32  ALT 16 21  ALKPHOS 50 40  BILITOT 0.5 0.9  PROT 6.0* 5.5*  ALBUMIN 3.3* 3.1*   Recent Labs  Lab 10/21/23 0846  LIPASE 33   No results for input(s): "AMMONIA" in the last 168 hours. CBC: Recent Labs  Lab 10/21/23 0846 10/21/23 1000 10/21/23 1930 10/22/23 0124 10/22/23 1228 10/23/23 0540  WBC 14.3*  --   --   --  7.1 7.8  NEUTROABS 11.0*  --   --   --   --  4.5  HGB 7.0* 6.8* 8.3* 8.0* 7.9* 7.1*  HCT 20.6* 20.0* 23.9* 23.3* 23.0* 20.7*  MCV 99.5  --   --   --  95.0 95.4  PLT 260  --   --   --  179 179   Cardiac Enzymes: No results for input(s): "CKTOTAL", "CKMB", "CKMBINDEX", "TROPONINI" in the last 168 hours. BNP: Invalid input(s): "POCBNP" CBG: No results for input(s): "GLUCAP" in the last 168 hours. D-Dimer No results for input(s): "DDIMER" in the last 72 hours. Hgb A1c No results for input(s): "HGBA1C" in the last 72 hours. Lipid Profile No results for input(s): "CHOL", "HDL", "LDLCALC", "TRIG", "CHOLHDL", "LDLDIRECT" in the last 72 hours. Thyroid function studies No results for input(s): "TSH", "T4TOTAL",  "T3FREE", "THYROIDAB" in the last 72 hours.  Invalid input(s): "FREET3" Anemia work up Recent Labs    10/21/23 1930  TIBC 302  IRON 253*   Urinalysis    Component Value Date/Time   COLORURINE YELLOW 12/24/2022 1622   APPEARANCEUR CLEAR 12/24/2022 1622   LABSPEC 1.010 12/24/2022 1622   PHURINE 6.0 12/24/2022 1622   GLUCOSEU NEGATIVE 12/24/2022 1622   HGBUR NEGATIVE 12/24/2022 1622   BILIRUBINUR NEGATIVE 12/24/2022 1622   KETONESUR NEGATIVE 12/24/2022 1622   UROBILINOGEN 0.2 12/24/2022 1622   NITRITE NEGATIVE 12/24/2022 1622   LEUKOCYTESUR NEGATIVE 12/24/2022 1622   Sepsis Labs Recent Labs  Lab 10/21/23 0846 10/22/23 1228 10/23/23 0540  WBC 14.3* 7.1 7.8   Microbiology No results found for this or any previous visit (from the past 240 hour(s)).   Time coordinating discharge:  35 minutes  SIGNED:   Dorcas Carrow, MD  [facility] 10/23/2023, 9:30 AM

## 2023-10-23 NOTE — Progress Notes (Signed)
[facility] Progress Note  Chief Complaint: Gastric ulcer with GI bleeding  History:  No further melena nor coffee-ground's emesis since prior to his upper endoscopy yesterday. Denies abdominal pain, says he was able to eat regular food yesterday.  Denies chest pain dyspnea or dysuria.  Anxious to go home to attend to some personal issues.    Objective:   Current Facility-Administered Medications:    albuterol (PROVENTIL) (2.5 MG/3ML) 0.083% nebulizer solution 2.5 mg, 2.5 mg, Nebulization, Q2H PRN, Danis, Starr Lake III, MD   atorvastatin (LIPITOR) tablet 20 mg, 20 mg, Oral, Daily, Danis, Starr Lake III, MD, 20 mg at 10/23/23 0906   iron sucrose (VENOFER) 300 mg in sodium chloride 0.9 % 250 mL IVPB, 300 mg, Intravenous, Once, Dorcas Carrow, MD, Last Rate: 176.7 mL/hr at 10/23/23 0906, 300 mg at 10/23/23 0906   nicotine (NICODERM CQ - dosed in mg/24 hours) patch 21 mg, 21 mg, Transdermal, Daily, Danis, Starr Lake III, MD, 21 mg at 10/23/23 0906   ondansetron (ZOFRAN) tablet 4 mg, 4 mg, Oral, Q6H PRN **OR** ondansetron (ZOFRAN) injection 4 mg, 4 mg, Intravenous, Q6H PRN, Danis, Starr Lake III, MD   pantoprazole (PROTONIX) EC tablet 40 mg, 40 mg, Oral, BID AC, Danis, Starr Lake III, MD, 40 mg at 10/23/23 0906   iron sucrose 300 mg (10/23/23 0906)     Vital signs in last 24 hrs: Vitals:   10/23/23 0409 10/23/23 0746  BP: 101/64 115/64  Pulse: 79 79  Resp: 17 18  Temp: 97.9 F (36.6 C) 98.4 F (36.9 C)  SpO2: 99% 100%    Intake/Output Summary (Last 24 hours) at 10/23/2023 0937 Last data filed at 10/22/2023 1754 Gross per 24 hour  Intake 50 ml  Output --  Net 50 ml     Physical Exam  Awake, conversational, pale.  He is wearing the blood-splattered sweat suit he was wearing upon hospital arrival Cardiac: RRR without murmurs, S1S2 heard, no peripheral edema Pulm: clear to auscultation bilaterally, normal RR and effort noted Abdomen: soft, no tenderness, with active bowel sounds. No  guarding or palpable hepatosplenomegaly  Recent Labs:     Latest Ref Rng & Units 10/23/2023    5:40 AM 10/22/2023   12:28 PM 10/22/2023    1:24 AM  CBC  WBC 4.0 - 10.5 K/uL 7.8  7.1    Hemoglobin 13.0 - 17.0 g/dL 7.1  7.9  8.0   Hematocrit 39.0 - 52.0 % 20.7  23.0  23.3   Platelets 150 - 400 K/uL 179  179      Recent Labs  Lab 10/21/23 0846  INR 1.0      Latest Ref Rng & Units 10/22/2023   12:28 PM 10/21/2023    7:30 PM 10/21/2023   10:00 AM  CMP  Glucose 70 - 99 mg/dL 161  096  045   BUN 6 - 20 mg/dL 25  37  51   Creatinine 0.61 - 1.24 mg/dL 4.09  8.11  9.14   Sodium 135 - 145 mmol/L 136  137  137   Potassium 3.5 - 5.1 mmol/L 4.3  2.9  2.8   Chloride 98 - 111 mmol/L 106  107  99   CO2 22 - 32 mmol/L 23  23    Calcium 8.9 - 10.3 mg/dL 8.3  8.4    Total Protein 6.5 - 8.1 g/dL 5.5     Total Bilirubin <1.2 mg/dL 0.9     Alkaline Phos 38 - 126 U/L  40     AST 15 - 41 U/L 32     ALT 0 - 44 U/L 21        Radiologic studies:   Assessment & Plan  Assessment: Gastric ulcer with acute blood loss anemia (ulcer likely NSAID induced, biopsies pending for H. pylori) Despite drop in hemoglobin, he is not continuing to bleed.  IV iron was ordered by the hospitalist service send appears scheduled for later this morning.  Will leave to the determination of hospital physician whether or not no unit of PRBCs is needed prior to discharge.  This patient can be discharged home today from my perspective with further treatment and follow-up plans as outlined in yesterday's EGD report.  All his questions were answered.   Collin Horne Office: [phone]

## 2023-10-23 NOTE — Progress Notes (Signed)
Discharge instructions reviewed with pt.  Copy of instructions given to pt. Pt informed scripts sent to his pharmacy for pick up. Pt's IV iron is still infusing, SW able to find clothes for pt to wear home, pt will discharge after IV iron infuses, pt plans to call an uber to take him home.   Collin Sebring,RN SWOT

## 2023-10-24 ENCOUNTER — Encounter (HOSPITAL_COMMUNITY): Payer: Self-pay | Admitting: Gastroenterology

## 2023-10-25 ENCOUNTER — Telehealth: Payer: Self-pay

## 2023-10-25 NOTE — Telephone Encounter (Signed)
Called patient and left voicemail.

## 2023-10-29 ENCOUNTER — Ambulatory Visit: Payer: 59 | Admitting: Dermatology

## 2023-11-07 ENCOUNTER — Other Ambulatory Visit: Payer: Self-pay

## 2023-11-07 DIAGNOSIS — K920 Hematemesis: Secondary | ICD-10-CM

## 2023-11-08 ENCOUNTER — Telehealth: Payer: Self-pay

## 2023-11-08 NOTE — Telephone Encounter (Signed)
Called patient to notify him of cbc recheck and to let him know we need to get him scheduled for a OV follow up- no answer and left voicemail.

## 2023-11-11 ENCOUNTER — Telehealth: Payer: Self-pay

## 2023-11-11 NOTE — Telephone Encounter (Signed)
Called patient and got him scheduled- reminded him of needed lab recheck and notified him of lab biopsies

## 2023-11-14 ENCOUNTER — Ambulatory Visit: Payer: 59 | Admitting: Internal Medicine

## 2023-11-21 ENCOUNTER — Other Ambulatory Visit: Payer: Self-pay | Admitting: Internal Medicine

## 2023-11-21 DIAGNOSIS — I10 Essential (primary) hypertension: Secondary | ICD-10-CM

## 2023-11-25 ENCOUNTER — Other Ambulatory Visit (INDEPENDENT_AMBULATORY_CARE_PROVIDER_SITE_OTHER): Payer: Medicaid Other

## 2023-11-25 DIAGNOSIS — K920 Hematemesis: Secondary | ICD-10-CM

## 2023-11-25 LAB — CBC WITH DIFFERENTIAL/PLATELET
Basophils Absolute: 0 10*3/uL (ref 0.0–0.1)
Basophils Relative: 0.5 % (ref 0.0–3.0)
Eosinophils Absolute: 0.1 10*3/uL (ref 0.0–0.7)
Eosinophils Relative: 0.8 % (ref 0.0–5.0)
HCT: 33.4 % — ABNORMAL LOW (ref 39.0–52.0)
Hemoglobin: 11.1 g/dL — ABNORMAL LOW (ref 13.0–17.0)
Lymphocytes Relative: 18 % (ref 12.0–46.0)
Lymphs Abs: 1.6 10*3/uL (ref 0.7–4.0)
MCHC: 33.1 g/dL (ref 30.0–36.0)
MCV: 105 fL — ABNORMAL HIGH (ref 78.0–100.0)
Monocytes Absolute: 0.6 10*3/uL (ref 0.1–1.0)
Monocytes Relative: 7.3 % (ref 3.0–12.0)
Neutro Abs: 6.4 10*3/uL (ref 1.4–7.7)
Neutrophils Relative %: 73.4 % (ref 43.0–77.0)
Platelets: 265 10*3/uL (ref 150.0–400.0)
RBC: 3.18 Mil/uL — ABNORMAL LOW (ref 4.22–5.81)
RDW: 18.4 % — ABNORMAL HIGH (ref 11.5–15.5)
WBC: 8.8 10*3/uL (ref 4.0–10.5)

## 2023-11-26 ENCOUNTER — Encounter: Payer: Self-pay | Admitting: Gastroenterology

## 2023-11-27 ENCOUNTER — Encounter: Payer: 59 | Admitting: Internal Medicine

## 2023-11-28 ENCOUNTER — Encounter: Payer: Self-pay | Admitting: Gastroenterology

## 2023-11-28 ENCOUNTER — Ambulatory Visit (INDEPENDENT_AMBULATORY_CARE_PROVIDER_SITE_OTHER): Payer: Medicaid Other | Admitting: Gastroenterology

## 2023-11-28 VITALS — BP 104/72 | HR 94 | Ht 65.0 in | Wt 160.0 lb

## 2023-11-28 DIAGNOSIS — D500 Iron deficiency anemia secondary to blood loss (chronic): Secondary | ICD-10-CM | POA: Diagnosis not present

## 2023-11-28 DIAGNOSIS — K2210 Ulcer of esophagus without bleeding: Secondary | ICD-10-CM

## 2023-11-28 DIAGNOSIS — E538 Deficiency of other specified B group vitamins: Secondary | ICD-10-CM

## 2023-11-28 DIAGNOSIS — K254 Chronic or unspecified gastric ulcer with hemorrhage: Secondary | ICD-10-CM

## 2023-11-28 DIAGNOSIS — D539 Nutritional anemia, unspecified: Secondary | ICD-10-CM

## 2023-11-28 MED ORDER — IRON (FERROUS SULFATE) 325 (65 FE) MG PO TABS: 1.0000 | ORAL_TABLET | Freq: Two times a day (BID) | ORAL | 0 refills | Status: DC

## 2023-11-28 MED ORDER — PANTOPRAZOLE SODIUM 40 MG PO TBEC: 40.0000 mg | DELAYED_RELEASE_TABLET | Freq: Two times a day (BID) | ORAL | 1 refills | Status: DC

## 2023-11-28 NOTE — Progress Notes (Signed)
[facility] Progress Note  Chief Complaint: Gastric and esophageal ulcers, iron deficiency anemia  Subjective  Prior history  Screening colonoscopy with Dr. Myrtie Neither May 2024 Hospital admission December 2024 coffee-ground's emesis, melena and acute blood loss anemia.   EGD 10/22/23 with clean-based EG junction ulcer and clean-based cratered prepyloric ulcer, no endoscopic therapy needed. Gastric biopsies negative for H. pylori.  Ulcer was suspected to have been NSAID related.   Collin Horne has not had any overt GI bleeding since hospital discharge.  He was concerned about his blood pressure today at 104/72 with a pulse of 94, and says he usually has hypertension.  However, he has not been on his blood pressure medicines for about the last month due to changes of insurance (had a lapse in [place], and says he might lose it at the end of this month), and Medicaid require to change her primary care provider who is not yet seen.  He sometimes feels lightheaded when he stands, no headaches visual changes chest pain or dyspnea.  Denies abdominal pain.  Stopped taking his PPI shortly after leaving the hospital because he thought it was for heartburn and did not understand it was for ulcer healing.  He has been taking the iron tablet.  Was getting B12 injections with primary care, most recently October 2024.  Was taking a lot of NSAIDs that likely led to the ulcer, says he went to his orthopedist since the hospital discharge and was given a prescription for Celebrex.  They told him it should be less likely to cause ulcers, and he has taken it sparingly, perhaps every 10 to 14 days.  ROS: Cardiovascular:  no chest pain Respiratory: no dyspnea Chronic ankle pain since the surgery Anxiety and depression Remainder systems negative set as above The patient's Past Medical, Family and Social History were reviewed and are on file in the EMR. Past Medical History:  Diagnosis Date   Anemia    Anxiety     Depression    Hyperlipidemia    Hypertension    Substance abuse [facility])     Past Surgical History:  Procedure Laterality Date   ANKLE SURGERY  11/12/2005   BIOPSY  10/22/2023   Procedure: BIOPSY;  Surgeon: Sherrilyn Rist, MD;  Location: [facility] ENDOSCOPY;  Service: Gastroenterology;;   COLONOSCOPY     ESOPHAGOGASTRODUODENOSCOPY (EGD) WITH PROPOFOL N/A 10/22/2023   Procedure: ESOPHAGOGASTRODUODENOSCOPY (EGD) WITH PROPOFOL;  Surgeon: Sherrilyn Rist, MD;  Location: [facility] ENDOSCOPY;  Service: Gastroenterology;  Laterality: N/A;   SIGMOIDOSCOPY     Social history: Self-described alcoholism, "not in remission"  Objective:  Med list reviewed  Current Outpatient Medications:    atorvastatin (LIPITOR) 20 MG tablet, TAKE 1 TABLET BY MOUTH EVERY DAY, Disp: 90 tablet, Rfl: 1   desvenlafaxine (PRISTIQ) 100 MG 24 hr tablet, Take 100 mg by mouth daily., Disp: , Rfl:    diphenhydrAMINE (BENADRYL) 25 mg capsule, Take 25-75 mg by mouth at bedtime as needed for sleep., Disp: , Rfl:    gabapentin (NEURONTIN) 800 MG tablet, Take 800 mg by mouth 3 (three) times daily as needed (nerve pain)., Disp: , Rfl:    HYDROcodone-acetaminophen (NORCO/VICODIN) 5-325 MG tablet, , Disp: , Rfl:    hydrOXYzine (VISTARIL) 25 MG capsule, , Disp: , Rfl:    lamoTRIgine (LAMICTAL) 100 MG tablet, Take 100 mg by mouth daily., Disp: , Rfl:    Multiple Vitamins-Minerals (MULTIVITAMIN WITH MINERALS) tablet, Take 1 tablet by mouth daily., Disp: , Rfl:  olmesartan (BENICAR) 20 MG tablet, TAKE 1 TABLET BY MOUTH DAILY. FOLLOW-UP APPT DUE IN SEPT MUST SEE PROVIDER FOR FUTURE REFILLS (Patient taking differently: Take 20 mg by mouth daily.), Disp: 90 tablet, Rfl: 0   propranolol (INDERAL) 10 MG tablet, Take 10 mg by mouth as needed (anxiety)., Disp: , Rfl:    sildenafil (VIAGRA) 100 MG tablet, Take 1 tablet (100 mg total) by mouth daily as needed for erectile dysfunction. (Patient taking differently: Take 50-100 mg by mouth daily as  needed for erectile dysfunction.), Disp: 30 tablet, Rfl: 0   traZODone (DESYREL) 50 MG tablet, Take 50-100 mg by mouth at bedtime as needed for sleep., Disp: , Rfl:    Vitamin D, Ergocalciferol, (DRISDOL) 1.25 MG (50000 UNIT) CAPS capsule, Take 1 capsule by mouth once a week., Disp: , Rfl:    Iron, Ferrous Sulfate, 325 (65 Fe) MG TABS, Take 1 tablet by mouth 2 (two) times daily., Disp: 60 tablet, Rfl: 0   pantoprazole (PROTONIX) 40 MG tablet, Take 1 tablet (40 mg total) by mouth 2 (two) times daily before a meal., Disp: 60 tablet, Rfl: 1   Vital signs in last 24 hrs: Vitals:   11/28/23 0944  BP: 104/72  Pulse: 94   Wt Readings from Last 3 Encounters:  11/28/23 160 lb (72.6 kg)  10/23/23 149 lb 14.6 oz (68 kg)  08/23/23 150 lb (68 kg)    Physical Exam  Well-appearing today.  Antalgic gait, gets on exam table without assistance HEENT: sclera anicteric, oral mucosa moist without lesions Neck: supple, no thyromegaly, JVD or lymphadenopathy Cardiac: Regular without appreciable murmur,  no peripheral edema Pulm: clear to auscultation bilaterally, normal RR and effort noted Abdomen: soft, no tenderness, with active bowel sounds. No guarding or palpable hepatosplenomegaly. Skin; warm and dry, no jaundice or rash   Labs:     Latest Ref Rng & Units 11/25/2023    9:21 AM 10/23/2023    5:40 AM 10/22/2023   12:28 PM  CBC  WBC 4.0 - 10.5 K/uL 8.8  7.8  7.1   Hemoglobin 13.0 - 17.0 g/dL 01.0  7.1  7.9   Hematocrit 39.0 - 52.0 % 33.4  20.7  23.0   Platelets 150.0 - 400.0 K/uL 265.0  179  179    (MCV elevated)  Last B12 was low in Feb 2024 Nml Folate 2024 ___________________________________________ Radiologic studies:   ____________________________________________ Other:   _____________________________________________   Encounter Diagnoses  Name Primary?   Iron deficiency anemia due to chronic blood loss Yes   Macrocytic anemia    Chronic gastric ulcer with hemorrhage     Ulcer of esophagus without bleeding    B12 deficiency    NSAID induced gastric and esophageal ulcers.  Underlying GERD contributing to the esophageal ulcer as well.  Has not been on PPI since hospital discharge, but was taking iron.  Chronic B12 deficiency and alcohol use also likely contributing to the macrocytic anemia.  He might lose his Medicaid at the end of the month, so not clear when will be able to do repeat labs to check his CBC, B12, iron levels and folic acid as well as an upper endoscopy to confirm ulcer healing.  That said, his gastric ulcer was benign appearing, H. pylori was negative, and it will probably heal once he is back on a sufficient course of PPI. Assessment and Plan     So for now, plan is to continue iron supplements for another 4 to 6 weeks until  the current supply runs out (1 month refill was given) Continue twice daily pantoprazole for the next 4 to 6 weeks (also refilled) He will contact us in a couple of weeks to let us know if his Medicaid is still active.  If so, we will schedule labs and a follow-up EGD to confirm ulcer healing.  Hopefully he can see his new primary care provider soon. Since he is not able to get B12 injections at this time with that change in provider (and we do not have that available at our office), he says he will pick up some oral B12 at [facility] and take 1 every day.  Strongly encouraged to work toward alcohol cessation.  I recommended he be very careful with only sparing use of the Celebrex because of his ulcers.  None NSAID pain control would be better for him.  30 minutes were spent on this encounter (including chart review, history/exam, counseling/coordination of care, and documentation) > 50% of that time was spent on counseling and coordination of care.   Charlie Pitter III

## 2023-12-05 ENCOUNTER — Ambulatory Visit: Payer: 59 | Admitting: Gastroenterology

## 2023-12-05 DIAGNOSIS — S82852D Displaced trimalleolar fracture of left lower leg, subsequent encounter for closed fracture with routine healing: Secondary | ICD-10-CM | POA: Diagnosis not present

## 2023-12-21 ENCOUNTER — Other Ambulatory Visit: Payer: Self-pay | Admitting: Internal Medicine

## 2023-12-21 DIAGNOSIS — E785 Hyperlipidemia, unspecified: Secondary | ICD-10-CM

## 2023-12-25 ENCOUNTER — Other Ambulatory Visit: Payer: Self-pay | Admitting: Gastroenterology

## 2024-02-03 ENCOUNTER — Ambulatory Visit: Admitting: Emergency Medicine

## 2024-02-17 ENCOUNTER — Telehealth: Payer: Self-pay | Admitting: Acute Care

## 2024-02-17 NOTE — Telephone Encounter (Signed)
 Patient called into [facility] and left VM to schedule annual LDCT. I called patient back but it rang busy, attempted again a couple minutes later and still had a busy signal. Will try again later.

## 2024-02-18 NOTE — Telephone Encounter (Signed)
 Attempted to reach patient again to schedule annual CT. LVM to call office and schedule annual CT.

## 2024-02-18 NOTE — Telephone Encounter (Signed)
 Spoke with patient. CT scheduled at [facility] on 03/31/2024 at 9:00 am.

## 2024-03-03 ENCOUNTER — Ambulatory Visit: Admitting: Internal Medicine

## 2024-03-03 ENCOUNTER — Encounter: Payer: Self-pay | Admitting: Internal Medicine

## 2024-03-03 VITALS — BP 118/78 | HR 97 | Temp 98.4°F | Resp 16 | Ht 65.0 in | Wt 162.6 lb

## 2024-03-03 DIAGNOSIS — D538 Other specified nutritional anemias: Secondary | ICD-10-CM | POA: Diagnosis not present

## 2024-03-03 DIAGNOSIS — I950 Idiopathic hypotension: Secondary | ICD-10-CM | POA: Insufficient documentation

## 2024-03-03 DIAGNOSIS — E785 Hyperlipidemia, unspecified: Secondary | ICD-10-CM

## 2024-03-03 DIAGNOSIS — R739 Hyperglycemia, unspecified: Secondary | ICD-10-CM

## 2024-03-03 DIAGNOSIS — I10 Essential (primary) hypertension: Secondary | ICD-10-CM

## 2024-03-03 DIAGNOSIS — K2100 Gastro-esophageal reflux disease with esophagitis, without bleeding: Secondary | ICD-10-CM

## 2024-03-03 DIAGNOSIS — D539 Nutritional anemia, unspecified: Secondary | ICD-10-CM | POA: Diagnosis not present

## 2024-03-03 DIAGNOSIS — E519 Thiamine deficiency, unspecified: Secondary | ICD-10-CM

## 2024-03-03 DIAGNOSIS — D510 Vitamin B12 deficiency anemia due to intrinsic factor deficiency: Secondary | ICD-10-CM

## 2024-03-03 DIAGNOSIS — F1020 Alcohol dependence, uncomplicated: Secondary | ICD-10-CM

## 2024-03-03 NOTE — Patient Instructions (Signed)

## 2024-03-03 NOTE — Progress Notes (Signed)
 Subjective:  Patient ID: Collin Horne, male    DOB: April 29, 1966  Age: 58 y.o. MRN: [id number]  CC: Anemia   HPI Collin Horne presents for f/up ----  Discussed the use of AI scribe software for clinical note transcription with the patient, who gave verbal consent to proceed.  History of Present Illness   Collin Goettl "Synetta Eves" is a 58 year old male who presents for follow-up of multiple medical issues including a history of upper GI bleeding and anemia.  Collin Horne has a history of an upper GI problem in December, which resulted in hospitalization for three days due to significant bleeding. Collin Horne has been anemic for most of his adult life and was taking iron  regularly for three months, but has not prioritized it recently. Collin Horne attributes the GI issue to overuse of NSAIDs, which Collin Horne has since stopped using except in extreme cases. Collin Horne is not currently taking any medication for pain.  Collin Horne is taking Benadryl and trazodone for insomnia, with an effort to wean off unnecessary medications. Hydroxyzine is used very rarely. Collin Horne continues to take gabapentin , Lamictal, olmesartan , atorvastatin , and propranolol (rarely, primarily before car trips).  Collin Horne vapes and has had intermittent alcohol use over the past seven months, despite having maintained sobriety for several years prior. Collin Horne experiences fatigue that comes and goes. No chest pain or shortness of breath.  His weight has increased by about ten pounds, which Collin Horne attributes to a sedentary lifestyle while recovering from a broken ankle. Collin Horne notes a decreased appetite potentially linked to an antacid prescribed after his GI issue, which makes him feel full quickly. Collin Horne experienced a significant weight loss of thirty pounds over an eighteen-month period, which Collin Horne describes as inexplicable.  Family history is notable for his mother being diagnosed with hypertrophic cardiomyopathy. His sister underwent an echocardiogram related to this and was found to be fine. Collin Horne has had  stress tests in the past but is unsure of the date of his last EKG.       Outpatient Medications Prior to Visit  Medication Sig Dispense Refill   atorvastatin  (LIPITOR) 20 MG tablet TAKE 1 TABLET BY MOUTH EVERY DAY 90 tablet 1   desvenlafaxine (PRISTIQ) 100 MG 24 hr tablet Take 100 mg by mouth daily.     ferrous sulfate  325 (65 FE) MG tablet TAKE 1 TABLET BY MOUTH TWICE A DAY (Patient taking differently: Take 325 mg by mouth as needed.) 180 tablet 1   gabapentin  (NEURONTIN ) 800 MG tablet Take 800 mg by mouth 3 (three) times daily as needed (nerve pain).     hydrOXYzine (VISTARIL) 25 MG capsule Take 25 mg by mouth as needed.     lamoTRIgine (LAMICTAL) 100 MG tablet Take 100 mg by mouth daily.     Multiple Vitamins-Minerals (MULTIVITAMIN WITH MINERALS) tablet Take 1 tablet by mouth daily.     pantoprazole  (PROTONIX ) 40 MG tablet TAKE 1 TABLET (40 MG TOTAL) BY MOUTH TWICE A DAY BEFORE MEALS (Patient taking differently: Take 40 mg by mouth as needed.) 180 tablet 1   propranolol (INDERAL) 10 MG tablet Take 10 mg by mouth as needed (anxiety).     sildenafil  (VIAGRA ) 100 MG tablet Take 1 tablet (100 mg total) by mouth daily as needed for erectile dysfunction. (Patient taking differently: Take 50-100 mg by mouth daily as needed for erectile dysfunction.) 30 tablet 0   traZODone (DESYREL) 50 MG tablet Take 50-100 mg by mouth at bedtime as needed for sleep.     olmesartan  (  BENICAR ) 20 MG tablet TAKE 1 TABLET BY MOUTH DAILY. FOLLOW-UP APPT DUE IN SEPT MUST SEE PROVIDER FOR FUTURE REFILLS (Patient taking differently: Take 20 mg by mouth daily.) 90 tablet 0   diphenhydrAMINE (BENADRYL) 25 mg capsule Take 25-75 mg by mouth at bedtime as needed for sleep.     HYDROcodone -acetaminophen  (NORCO/VICODIN) 5-325 MG tablet      No facility-administered medications prior to visit.    ROS Review of Systems  Constitutional:  Positive for fatigue and unexpected weight change (wt gain). Negative for appetite change,  chills, diaphoresis and fever.  HENT: Negative.  Negative for sore throat and trouble swallowing.   Eyes: Negative.   Respiratory:  Negative for cough, chest tightness, shortness of breath and wheezing.   Cardiovascular:  Negative for chest pain, palpitations and leg swelling.  Gastrointestinal: Negative.  Negative for abdominal pain, blood in stool, constipation, diarrhea, nausea and vomiting.  Endocrine: Negative.   Genitourinary: Negative.  Negative for difficulty urinating.  Musculoskeletal:  Positive for arthralgias. Negative for gait problem, myalgias and neck pain.  Skin:  Positive for pallor. Negative for color change.  Neurological: Negative.  Negative for dizziness, weakness and light-headedness.  Hematological:  Negative for adenopathy. Does not bruise/bleed easily.  Psychiatric/Behavioral:  Positive for confusion, decreased concentration and sleep disturbance. Negative for behavioral problems, dysphoric mood, hallucinations, self-injury and suicidal ideas. The patient is not nervous/anxious.     Objective:  BP 118/78 (BP Location: Left Arm, Patient Position: Sitting, Cuff Size: Normal)   Pulse 97   Temp 98.4 F (36.9 C) (Oral)   Resp 16   Ht 5\' 5"  (1.651 m)   Wt 162 lb 9.6 oz (73.8 kg)   SpO2 96%   BMI 27.06 kg/m   BP Readings from Last 3 Encounters:  03/03/24 118/78  11/28/23 104/72  10/23/23 115/64    Wt Readings from Last 3 Encounters:  03/03/24 162 lb 9.6 oz (73.8 kg)  11/28/23 160 lb (72.6 kg)  10/23/23 149 lb 14.6 oz (68 kg)    Physical Exam Vitals reviewed.  Constitutional:      General: Collin Horne is not in acute distress.    Appearance: Collin Horne is ill-appearing. Collin Horne is not toxic-appearing or diaphoretic.  HENT:     Nose: Nose normal.     Mouth/Throat:     Mouth: Mucous membranes are moist.  Eyes:     General: No scleral icterus.    Conjunctiva/sclera: Conjunctivae normal.  Cardiovascular:     Rate and Rhythm: Regular rhythm. Tachycardia present.     Heart  sounds: No murmur heard.    No friction rub. No gallop.     Comments: EKG-- NSR, 84 bpm No LVH, Q waves, or ST/T wave changes  Unchanged Pulmonary:     Effort: Pulmonary effort is normal.     Breath sounds: No stridor. No wheezing, rhonchi or rales.  Abdominal:     General: Abdomen is flat.     Palpations: There is no mass.     Tenderness: There is no abdominal tenderness. There is no guarding.     Hernia: No hernia is present.  Genitourinary:    Prostate: Enlarged. Not tender and no nodules present.     Rectum: Normal. Guaiac result negative. No mass, tenderness, anal fissure or external hemorrhoid. Normal anal tone.  Musculoskeletal:        General: Normal range of motion.     Cervical back: Neck supple. No tenderness.     Right lower leg: No edema.  Left lower leg: No edema.  Lymphadenopathy:     Cervical: No cervical adenopathy.  Skin:    General: Skin is warm.     Coloration: Skin is pale.     Findings: No rash.  Neurological:     General: No focal deficit present.     Mental Status: Collin Horne is alert. Mental status is at baseline.  Psychiatric:        Mood and Affect: Mood normal.        Behavior: Behavior normal.        Thought Content: Thought content normal.        Judgment: Judgment normal.     Lab Results  Component Value Date   WBC 8.8 11/25/2023   HGB 11.1 (L) 11/25/2023   HCT 33.4 (L) 11/25/2023   PLT 265.0 11/25/2023   GLUCOSE 111 (H) 10/22/2023   CHOL 189 12/24/2022   TRIG 89.0 12/24/2022   HDL 66.40 12/24/2022   LDLDIRECT 70.0 12/21/2021   LDLCALC 104 (H) 12/24/2022   ALT 21 10/22/2023   AST 32 10/22/2023   NA 136 10/22/2023   K 4.3 10/22/2023   CL 106 10/22/2023   CREATININE 0.91 10/22/2023   BUN 25 (H) 10/22/2023   CO2 23 10/22/2023   TSH 0.86 10/24/2022   PSA 0.74 10/24/2022   INR 1.0 10/21/2023    No results found.  Assessment & Plan:   Deficiency anemia- Will screen for vitamin deficiencies. -     IBC + Ferritin; Future -      Zinc; Future -     Folate; Future -     CBC with Differential/Platelet; Future -     Vitamin B12; Future -     Vitamin B1; Future  Anemia due to acquired thiamine deficiency -     CBC with Differential/Platelet; Future -     Vitamin B1; Future  Primary hypertension- BP is over-controlled. -     TSH; Future -     Basic metabolic panel with GFR; Future -     EKG 12-Lead  Dyslipidemia, goal LDL below 70 -     Lipid panel; Future -     TSH; Future -     Hepatic function panel; Future  Vitamin B12 deficiency anemia due to intrinsic factor deficiency -     Folate; Future -     CBC with Differential/Platelet; Future -     Vitamin B12; Future  Gastroesophageal reflux disease with esophagitis without hemorrhage -     CBC with Differential/Platelet; Future  Uncomplicated alcohol dependence (HCC) -     Hepatic function panel; Future -     Vitamin B1; Future  Chronic hyperglycemia -     Hemoglobin A1c; Future -     Fructosamine; Future  Idiopathic hypotension -     Cortisol; Future     Follow-up: Return in about 3 months (around 06/02/2024).  Sandra Crouch, MD

## 2024-03-05 DIAGNOSIS — S82852D Displaced trimalleolar fracture of left lower leg, subsequent encounter for closed fracture with routine healing: Secondary | ICD-10-CM | POA: Diagnosis not present

## 2024-03-11 ENCOUNTER — Encounter: Payer: Self-pay | Admitting: Internal Medicine

## 2024-03-13 ENCOUNTER — Encounter: Payer: Self-pay | Admitting: Internal Medicine

## 2024-03-27 ENCOUNTER — Other Ambulatory Visit: Payer: Self-pay

## 2024-03-27 DIAGNOSIS — Z87891 Personal history of nicotine dependence: Secondary | ICD-10-CM

## 2024-03-27 DIAGNOSIS — F17210 Nicotine dependence, cigarettes, uncomplicated: Secondary | ICD-10-CM

## 2024-03-27 DIAGNOSIS — Z122 Encounter for screening for malignant neoplasm of respiratory organs: Secondary | ICD-10-CM

## 2024-03-31 ENCOUNTER — Ambulatory Visit (HOSPITAL_COMMUNITY)

## 2024-04-09 ENCOUNTER — Other Ambulatory Visit: Payer: Self-pay | Admitting: Internal Medicine

## 2024-04-09 ENCOUNTER — Other Ambulatory Visit (INDEPENDENT_AMBULATORY_CARE_PROVIDER_SITE_OTHER)

## 2024-04-09 DIAGNOSIS — D510 Vitamin B12 deficiency anemia due to intrinsic factor deficiency: Secondary | ICD-10-CM

## 2024-04-09 DIAGNOSIS — F1020 Alcohol dependence, uncomplicated: Secondary | ICD-10-CM

## 2024-04-09 DIAGNOSIS — I950 Idiopathic hypotension: Secondary | ICD-10-CM | POA: Diagnosis not present

## 2024-04-09 DIAGNOSIS — E785 Hyperlipidemia, unspecified: Secondary | ICD-10-CM

## 2024-04-09 DIAGNOSIS — K2100 Gastro-esophageal reflux disease with esophagitis, without bleeding: Secondary | ICD-10-CM

## 2024-04-09 DIAGNOSIS — D538 Other specified nutritional anemias: Secondary | ICD-10-CM

## 2024-04-09 DIAGNOSIS — I10 Essential (primary) hypertension: Secondary | ICD-10-CM | POA: Diagnosis not present

## 2024-04-09 DIAGNOSIS — E519 Thiamine deficiency, unspecified: Secondary | ICD-10-CM

## 2024-04-09 DIAGNOSIS — R739 Hyperglycemia, unspecified: Secondary | ICD-10-CM

## 2024-04-09 DIAGNOSIS — D539 Nutritional anemia, unspecified: Secondary | ICD-10-CM | POA: Diagnosis not present

## 2024-04-09 LAB — CBC WITH DIFFERENTIAL/PLATELET
Basophils Absolute: 0 10*3/uL (ref 0.0–0.1)
Basophils Relative: 0.4 % (ref 0.0–3.0)
Eosinophils Absolute: 0.2 10*3/uL (ref 0.0–0.7)
Eosinophils Relative: 2 % (ref 0.0–5.0)
HCT: 38.2 % — ABNORMAL LOW (ref 39.0–52.0)
Hemoglobin: 12.6 g/dL — ABNORMAL LOW (ref 13.0–17.0)
Lymphocytes Relative: 19 % (ref 12.0–46.0)
Lymphs Abs: 1.5 10*3/uL (ref 0.7–4.0)
MCHC: 33.1 g/dL (ref 30.0–36.0)
MCV: 105 fl — ABNORMAL HIGH (ref 78.0–100.0)
Monocytes Absolute: 0.6 10*3/uL (ref 0.1–1.0)
Monocytes Relative: 8.1 % (ref 3.0–12.0)
Neutro Abs: 5.6 10*3/uL (ref 1.4–7.7)
Neutrophils Relative %: 70.5 % (ref 43.0–77.0)
Platelets: 276 10*3/uL (ref 150.0–400.0)
RBC: 3.64 Mil/uL — ABNORMAL LOW (ref 4.22–5.81)
RDW: 15.5 % (ref 11.5–15.5)
WBC: 7.9 10*3/uL (ref 4.0–10.5)

## 2024-04-09 LAB — URINALYSIS, ROUTINE W REFLEX MICROSCOPIC
Bilirubin Urine: NEGATIVE
Hgb urine dipstick: NEGATIVE
Ketones, ur: NEGATIVE
Leukocytes,Ua: NEGATIVE
Nitrite: NEGATIVE
RBC / HPF: NONE SEEN (ref 0–?)
Specific Gravity, Urine: <=1.005 — AB (ref 1.000–1.030)
Total Protein, Urine: NEGATIVE
Urine Glucose: NEGATIVE
Urobilinogen, UA: 0.2 (ref 0.0–1.0)
WBC, UA: NONE SEEN (ref 0–?)
pH: 6 (ref 5.0–8.0)

## 2024-04-09 LAB — HEPATIC FUNCTION PANEL
ALT: 25 U/L (ref 0–53)
AST: 32 U/L (ref 0–37)
Albumin: 3.6 g/dL (ref 3.5–5.2)
Alkaline Phosphatase: 138 U/L — ABNORMAL HIGH (ref 39–117)
Bilirubin, Direct: 0.1 mg/dL (ref 0.0–0.3)
Total Bilirubin: 0.3 mg/dL (ref 0.2–1.2)
Total Protein: 7 g/dL (ref 6.0–8.3)

## 2024-04-09 LAB — BASIC METABOLIC PANEL WITH GFR
BUN: 6 mg/dL (ref 6–23)
CO2: 27 meq/L (ref 19–32)
Calcium: 8.7 mg/dL (ref 8.4–10.5)
Chloride: 101 meq/L (ref 96–112)
Creatinine, Ser: 1 mg/dL (ref 0.40–1.50)
GFR: 83.49 mL/min (ref >60.00)
Glucose, Bld: 119 mg/dL — ABNORMAL HIGH (ref 70–99)
Potassium: 3.6 meq/L (ref 3.5–5.1)
Sodium: 137 meq/L (ref 135–145)

## 2024-04-09 LAB — IBC + FERRITIN
Ferritin: 211.2 ng/mL (ref 22.0–322.0)
Iron: 64 ug/dL (ref 42–165)
Saturation Ratios: 24.6 % (ref 20.0–50.0)
TIBC: 260.4 ug/dL (ref 250.0–450.0)
Transferrin: 186 mg/dL — ABNORMAL LOW (ref 212.0–360.0)

## 2024-04-09 LAB — HEMOGLOBIN A1C: Hgb A1c MFr Bld: 5 % (ref 4.6–6.5)

## 2024-04-09 LAB — CORTISOL: Cortisol, Plasma: 14.9 ug/dL

## 2024-04-09 LAB — LIPID PANEL
Cholesterol: 113 mg/dL (ref 0–200)
HDL: 69.4 mg/dL (ref >39.00)
LDL Cholesterol: 31 mg/dL (ref 0–99)
NonHDL: 43.51
Total CHOL/HDL Ratio: 2
Triglycerides: 64 mg/dL (ref 0.0–149.0)
VLDL: 12.8 mg/dL (ref 0.0–40.0)

## 2024-04-09 LAB — FOLATE: Folate: 8.4 ng/mL (ref >5.9)

## 2024-04-09 LAB — TSH: TSH: 1.77 u[IU]/mL (ref 0.35–5.50)

## 2024-04-09 LAB — VITAMIN B12: Vitamin B-12: 649 pg/mL (ref 211–911)

## 2024-04-13 ENCOUNTER — Other Ambulatory Visit: Payer: Self-pay | Admitting: Internal Medicine

## 2024-04-13 ENCOUNTER — Ambulatory Visit: Payer: Self-pay | Admitting: Internal Medicine

## 2024-04-13 DIAGNOSIS — D538 Other specified nutritional anemias: Secondary | ICD-10-CM | POA: Insufficient documentation

## 2024-04-13 LAB — VITAMIN B1: Vitamin B1 (Thiamine): 8 nmol/L (ref 8–30)

## 2024-04-13 LAB — ZINC: Zinc: 58 ug/dL — ABNORMAL LOW (ref 60–130)

## 2024-04-13 LAB — FRUCTOSAMINE: Fructosamine: 243 umol/L (ref 205–285)

## 2024-04-13 MED ORDER — ZINC GLUCONATE 50 MG PO TABS: 50.0000 mg | ORAL_TABLET | Freq: Every day | ORAL | 1 refills | Status: DC

## 2024-05-19 ENCOUNTER — Encounter: Payer: Self-pay | Admitting: Internal Medicine

## 2024-05-19 ENCOUNTER — Ambulatory Visit: Admitting: Internal Medicine

## 2024-05-19 VITALS — BP 120/72 | HR 72 | Temp 98.2°F | Resp 16 | Ht 65.0 in | Wt 165.2 lb

## 2024-05-19 DIAGNOSIS — K279 Peptic ulcer, site unspecified, unspecified as acute or chronic, without hemorrhage or perforation: Secondary | ICD-10-CM

## 2024-05-19 DIAGNOSIS — I10 Essential (primary) hypertension: Secondary | ICD-10-CM | POA: Diagnosis not present

## 2024-05-19 DIAGNOSIS — E876 Hypokalemia: Secondary | ICD-10-CM

## 2024-05-19 DIAGNOSIS — D539 Nutritional anemia, unspecified: Secondary | ICD-10-CM | POA: Insufficient documentation

## 2024-05-19 DIAGNOSIS — R531 Weakness: Secondary | ICD-10-CM

## 2024-05-19 DIAGNOSIS — D510 Vitamin B12 deficiency anemia due to intrinsic factor deficiency: Secondary | ICD-10-CM | POA: Diagnosis not present

## 2024-05-19 DIAGNOSIS — F1010 Alcohol abuse, uncomplicated: Secondary | ICD-10-CM

## 2024-05-19 DIAGNOSIS — R0609 Other forms of dyspnea: Secondary | ICD-10-CM | POA: Insufficient documentation

## 2024-05-19 LAB — CBC WITH DIFFERENTIAL/PLATELET
Basophils Absolute: 0 K/uL (ref 0.0–0.1)
Basophils Relative: 0.5 % (ref 0.0–3.0)
Eosinophils Absolute: 0.2 K/uL (ref 0.0–0.7)
Eosinophils Relative: 3.8 % (ref 0.0–5.0)
HCT: 33.9 % — ABNORMAL LOW (ref 39.0–52.0)
Hemoglobin: 11.8 g/dL — ABNORMAL LOW (ref 13.0–17.0)
Lymphocytes Relative: 30.1 % (ref 12.0–46.0)
Lymphs Abs: 1.6 K/uL (ref 0.7–4.0)
MCHC: 34.8 g/dL (ref 30.0–36.0)
MCV: 100.9 fl — ABNORMAL HIGH (ref 78.0–100.0)
Monocytes Absolute: 0.5 K/uL (ref 0.1–1.0)
Monocytes Relative: 9.3 % (ref 3.0–12.0)
Neutro Abs: 3 K/uL (ref 1.4–7.7)
Neutrophils Relative %: 56.3 % (ref 43.0–77.0)
Platelets: 263 K/uL (ref 150.0–400.0)
RBC: 3.36 Mil/uL — ABNORMAL LOW (ref 4.22–5.81)
RDW: 15.7 % — ABNORMAL HIGH (ref 11.5–15.5)
WBC: 5.2 K/uL (ref 4.0–10.5)

## 2024-05-19 LAB — BASIC METABOLIC PANEL WITH GFR
BUN: 2 mg/dL — ABNORMAL LOW (ref 6–23)
CO2: 34 meq/L — ABNORMAL HIGH (ref 19–32)
Calcium: 7.8 mg/dL — ABNORMAL LOW (ref 8.4–10.5)
Chloride: 94 meq/L — ABNORMAL LOW (ref 96–112)
Creatinine, Ser: 0.94 mg/dL (ref 0.40–1.50)
GFR: 89.85 mL/min (ref >60.00)
Glucose, Bld: 116 mg/dL — ABNORMAL HIGH (ref 70–99)
Potassium: 3.2 meq/L — ABNORMAL LOW (ref 3.5–5.1)
Sodium: 138 meq/L (ref 135–145)

## 2024-05-19 LAB — IBC + FERRITIN
Ferritin: 427.6 ng/mL — ABNORMAL HIGH (ref 22.0–322.0)
Iron: 104 ug/dL (ref 42–165)
Saturation Ratios: 46.1 % (ref 20.0–50.0)
TIBC: 225.4 ug/dL — ABNORMAL LOW (ref 250.0–450.0)
Transferrin: 161 mg/dL — ABNORMAL LOW (ref 212.0–360.0)

## 2024-05-19 LAB — TROPONIN I (HIGH SENSITIVITY): High Sens Troponin I: 8 ng/L (ref 2–17)

## 2024-05-19 MED ORDER — THIAMINE HCL 100 MG PO TABS: 100.0000 mg | ORAL_TABLET | Freq: Every day | ORAL | 1 refills | Status: DC

## 2024-05-19 MED ORDER — POTASSIUM CHLORIDE ER 10 MEQ PO TBCR: 10.0000 meq | EXTENDED_RELEASE_TABLET | Freq: Two times a day (BID) | ORAL | 0 refills | Status: DC

## 2024-05-19 NOTE — Progress Notes (Signed)
 Subjective:  Patient ID: Collin Horne, male    DOB: 10-Feb-1966  Age: 58 y.o. MRN: [id number]  CC: GI Problem (Occasional times where he cannot keep food/liquids down, vomits. Exertion sometimes making him vomit )   HPI Collin Horne presents for f/up -----  Discussed the use of AI scribe software for clinical note transcription with the patient, who gave verbal consent to proceed.  History of Present Illness   Collin Horne is a 58 year old male who presents with lightheadedness and weakness.  He has been experiencing lightheadedness since waking up today, which he describes as unusual. There have been no changes in his medication since the last visit.  He has had generalized muscle weakness for about a year, which fluctuates but has improved compared to the past. He experiences shortness of breath with physical exertion, such as walking around his apartment, which has been occurring for less than a year. No muscle pain or aching is reported.  For the past couple of months, he has been experiencing vomiting upon exertion. There is no trouble swallowing or painful swallowing. Vomiting sometimes occurs in conjunction with a gastrointestinal issue that causes dehydration and lasts a couple of days. He last vomited on Sunday, and it was mostly dry heaving without any blood or coffee ground appearance.  In December, he was hospitalized for three days due to hematemesis. A GI specialist informed him of a mild issue related to NSAID use and prescribed Pantoxen twice a day. He has not followed up with the specialist due to insurance concerns.  He mentions that cessation of alcohol seems more correlated with nausea and vomiting than actual drinking. He last consumed alcohol last night, drinking a malt liquor. He has tried naltrexone  in the past to help with alcohol cessation but did not find it effective, possibly due to inconsistent use.       Outpatient Medications Prior to Visit   Medication Sig Dispense Refill   atorvastatin  (LIPITOR) 20 MG tablet TAKE 1 TABLET BY MOUTH EVERY DAY 90 tablet 1   desvenlafaxine (PRISTIQ) 100 MG 24 hr tablet Take 100 mg by mouth daily.     ferrous sulfate  325 (65 FE) MG tablet TAKE 1 TABLET BY MOUTH TWICE A DAY (Patient taking differently: Take 325 mg by mouth as needed.) 180 tablet 1   gabapentin  (NEURONTIN ) 800 MG tablet Take 800 mg by mouth 3 (three) times daily as needed (nerve pain).     lamoTRIgine (LAMICTAL) 100 MG tablet Take 100 mg by mouth daily.     Multiple Vitamins-Minerals (MULTIVITAMIN WITH MINERALS) tablet Take 1 tablet by mouth daily.     pantoprazole  (PROTONIX ) 40 MG tablet TAKE 1 TABLET (40 MG TOTAL) BY MOUTH TWICE A DAY BEFORE MEALS (Patient taking differently: Take 40 mg by mouth as needed.) 180 tablet 1   propranolol (INDERAL) 10 MG tablet Take 10 mg by mouth as needed (anxiety).     traZODone (DESYREL) 50 MG tablet Take 50-100 mg by mouth at bedtime as needed for sleep.     zinc  gluconate 50 MG tablet Take 1 tablet (50 mg total) by mouth daily. 90 tablet 1   hydrOXYzine (VISTARIL) 25 MG capsule Take 25 mg by mouth as needed.     sildenafil  (VIAGRA ) 100 MG tablet Take 1 tablet (100 mg total) by mouth daily as needed for erectile dysfunction. (Patient taking differently: Take 50-100 mg by mouth daily as needed for erectile dysfunction.) 30 tablet 0   No facility-administered medications  prior to visit.    ROS Review of Systems  Constitutional:  Negative for appetite change, chills, diaphoresis, fatigue and fever.  HENT: Negative.  Negative for sinus pressure, sore throat and trouble swallowing.   Respiratory:  Positive for shortness of breath. Negative for cough, chest tightness and wheezing.   Cardiovascular:  Negative for chest pain, palpitations and leg swelling.  Gastrointestinal:  Positive for nausea and vomiting. Negative for anal bleeding, blood in stool, constipation and diarrhea.  Genitourinary: Negative.   Negative for difficulty urinating.  Musculoskeletal: Negative.  Negative for arthralgias and myalgias.  Skin: Negative.   Neurological:  Positive for dizziness and light-headedness.  Hematological:  Negative for adenopathy. Does not bruise/bleed easily.  Psychiatric/Behavioral:  Positive for confusion, decreased concentration and sleep disturbance. The patient is nervous/anxious.     Objective:  BP 120/72 (BP Location: Left Arm, Patient Position: Sitting)   Pulse 72   Temp 98.2 F (36.8 C) (Temporal)   Resp 16   Ht 5' 5 (1.651 m)   Wt 165 lb 3.2 oz (74.9 kg)   SpO2 96%   BMI 27.49 kg/m   BP Readings from Last 3 Encounters:  05/19/24 120/72  03/03/24 118/78  11/28/23 104/72    Wt Readings from Last 3 Encounters:  05/19/24 165 lb 3.2 oz (74.9 kg)  03/03/24 162 lb 9.6 oz (73.8 kg)  11/28/23 160 lb (72.6 kg)    Physical Exam Vitals reviewed.  Constitutional:      General: He is not in acute distress.    Appearance: He is ill-appearing. He is not toxic-appearing or diaphoretic.  HENT:     Mouth/Throat:     Mouth: Mucous membranes are moist.  Eyes:     General: No scleral icterus.    Conjunctiva/sclera: Conjunctivae normal.  Cardiovascular:     Rate and Rhythm: Normal rate and regular rhythm.     Heart sounds: No murmur heard.    No friction rub. No gallop.     Comments: EKG--- NSR, 65 bpm No LVH, Q waves, or ST/T wave changes  Pulmonary:     Effort: Pulmonary effort is normal.     Breath sounds: No stridor. No wheezing, rhonchi or rales.  Abdominal:     General: Abdomen is flat.     Palpations: There is no mass.     Tenderness: There is no abdominal tenderness. There is no guarding.     Hernia: No hernia is present.  Genitourinary:    Comments: DRE deferred at his request Musculoskeletal:     Cervical back: Neck supple.     Right lower leg: No edema.     Left lower leg: No edema.  Lymphadenopathy:     Cervical: No cervical adenopathy.  Skin:    General:  Skin is warm.     Coloration: Skin is pale. Skin is not jaundiced.     Findings: No rash.  Neurological:     General: No focal deficit present.     Mental Status: He is alert. Mental status is at baseline.  Psychiatric:        Mood and Affect: Mood normal.        Behavior: Behavior normal.     Lab Results  Component Value Date   WBC 5.2 05/19/2024   HGB 11.8 (L) 05/19/2024   HCT 33.9 (L) 05/19/2024   PLT 263.0 05/19/2024   GLUCOSE 116 (H) 05/19/2024   CHOL 113 04/09/2024   TRIG 64.0 04/09/2024   HDL 69.40 04/09/2024  LDLDIRECT 70.0 12/21/2021   LDLCALC 31 04/09/2024   ALT 25 04/09/2024   AST 32 04/09/2024   NA 138 05/19/2024   K 3.2 (L) 05/19/2024   CL 94 (L) 05/19/2024   CREATININE 0.94 05/19/2024   BUN 2 (L) 05/19/2024   CO2 34 (H) 05/19/2024   TSH 1.77 04/09/2024   PSA 0.74 10/24/2022   INR 1.0 10/21/2023   HGBA1C 5.0 04/09/2024    No results found.  Assessment & Plan:  DOE (dyspnea on exertion)- EKG and labs are reassuring. Will evaluate with a coronary artery CT scan. -     EKG 12-Lead -     Troponin I (High Sensitivity); Future -     CT CORONARY MORPH W/CTA COR W/SCORE W/CA W/CM &/OR WO/CM; Future  Vitamin B12 deficiency anemia due to intrinsic factor deficiency -     CBC with Differential/Platelet; Future  Deficiency anemia -     IBC + Ferritin; Future -     CBC with Differential/Platelet; Future  Primary hypertension -     Basic metabolic panel with GFR; Future  Weakness -     Acetylcholine receptor, binding; Future -     Striated muscle antibody; Future  PUD (peptic ulcer disease) -     Ambulatory referral to Gastroenterology  Alcohol abuse -     Thiamine  HCl; Take 1 tablet (100 mg total) by mouth daily.  Dispense: 90 tablet; Refill: 1 -     AMB Referral [facility]BCI Care Management -     Naltrexone  HCl; Take 1 tablet (50 mg total) by mouth daily.  Dispense: 90 tablet; Refill: 0  Hypokalemia due to excessive gastrointestinal loss of potassium -      Potassium Chloride  ER; Take 1 tablet (10 mEq total) by mouth 2 (two) times daily.  Dispense: 180 tablet; Refill: 0     Follow-up: Return in about 3 months (around 08/19/2024).  Debby Molt, MD

## 2024-05-19 NOTE — Patient Instructions (Signed)

## 2024-05-20 MED ORDER — NALTREXONE HCL 50 MG PO TABS: 50.0000 mg | ORAL_TABLET | Freq: Every day | ORAL | 0 refills | Status: DC

## 2024-05-26 ENCOUNTER — Ambulatory Visit: Payer: Self-pay | Admitting: Internal Medicine

## 2024-05-26 ENCOUNTER — Other Ambulatory Visit: Payer: Self-pay | Admitting: Internal Medicine

## 2024-05-26 ENCOUNTER — Encounter: Payer: Self-pay | Admitting: Neurology

## 2024-05-26 DIAGNOSIS — R531 Weakness: Secondary | ICD-10-CM

## 2024-05-26 LAB — STRIATED MUSCLE ANTIBODY: STRIATED MUSCLE AB SCREEN: NEGATIVE

## 2024-05-26 LAB — ACETYLCHOLINE RECEPTOR, BINDING: A CHR BINDING ABS: 0.72 nmol/L — ABNORMAL HIGH

## 2024-05-27 ENCOUNTER — Other Ambulatory Visit (HOSPITAL_COMMUNITY): Payer: Self-pay | Admitting: *Deleted

## 2024-05-27 MED ORDER — METOPROLOL TARTRATE 100 MG PO TABS: ORAL_TABLET | ORAL | 0 refills | Status: DC

## 2024-05-28 ENCOUNTER — Encounter (HOSPITAL_COMMUNITY): Payer: Self-pay

## 2024-06-01 ENCOUNTER — Ambulatory Visit (HOSPITAL_COMMUNITY)
Admission: RE | Admit: 2024-06-01 | Discharge: 2024-06-01 | Disposition: A | Discharge status: Home / Self Care | Source: Ambulatory Visit | Attending: Internal Medicine | Admitting: Internal Medicine

## 2024-06-01 ENCOUNTER — Other Ambulatory Visit (HOSPITAL_COMMUNITY): Payer: Self-pay | Admitting: Emergency Medicine

## 2024-06-01 ENCOUNTER — Telehealth: Payer: Self-pay | Admitting: *Deleted

## 2024-06-01 DIAGNOSIS — I2584 Coronary atherosclerosis due to calcified coronary lesion: Secondary | ICD-10-CM | POA: Insufficient documentation

## 2024-06-01 DIAGNOSIS — R0609 Other forms of dyspnea: Secondary | ICD-10-CM | POA: Diagnosis present

## 2024-06-01 DIAGNOSIS — I2510 Atherosclerotic heart disease of native coronary artery without angina pectoris: Secondary | ICD-10-CM | POA: Insufficient documentation

## 2024-06-01 MED ORDER — IOHEXOL 350 MG/ML SOLN
100.0000 mL | Freq: Once | INTRAVENOUS | Status: AC | PRN
  Administered 2024-06-01: 100 mL via INTRAVENOUS

## 2024-06-01 MED ORDER — NITROGLYCERIN 0.4 MG SL SUBL
0.8000 mg | SUBLINGUAL_TABLET | Freq: Once | SUBLINGUAL | Status: AC
Start: 2024-06-01 — End: 2024-06-01
  Administered 2024-06-01: 0.8 mg via SUBLINGUAL

## 2024-06-01 NOTE — Progress Notes (Signed)
 Complex Care Management Note  Care Guide Note 06/01/2024 Name: Buck Mcaffee MRN: [id number] DOB: 1966/04/20  Minnie Shi is a 58 y.o. year old male who sees Joshua Debby CROME, MD for primary care. I reached out to Deyton Renegar by phone today to offer complex care management services.  Mr. Empey was given information about Complex Care Management services today including:   The Complex Care Management services include support from the care team which includes your Nurse Care Manager, Clinical Social Worker, or Pharmacist.  The Complex Care Management team is here to help remove barriers to the health concerns and goals most important to you. Complex Care Management services are voluntary, and the patient may decline or stop services at any time by request to their care team member.   Complex Care Management Consent Status: Patient agreed to services and verbal consent obtained.   Follow up plan:  Telephone appointment with complex care management team member scheduled for:  06/03/2024  Encounter Outcome:  Patient Scheduled  Thedford Franks, CMA [place]  [facility], [facility] Guide Direct Dial: [phone]  Fax: [phone] Website: [email or web address]

## 2024-06-03 ENCOUNTER — Telehealth: Payer: Self-pay | Admitting: Licensed Clinical Social Worker

## 2024-06-06 ENCOUNTER — Ambulatory Visit (HOSPITAL_COMMUNITY)
Admission: RE | Admit: 2024-06-06 | Discharge: 2024-06-06 | Disposition: A | Discharge status: Home / Self Care | Payer: Self-pay | Source: Ambulatory Visit | Attending: Cardiology | Admitting: Cardiology

## 2024-06-06 ENCOUNTER — Other Ambulatory Visit: Payer: Self-pay | Admitting: Cardiology

## 2024-06-06 DIAGNOSIS — R931 Abnormal findings on diagnostic imaging of heart and coronary circulation: Secondary | ICD-10-CM | POA: Insufficient documentation

## 2024-06-07 ENCOUNTER — Ambulatory Visit: Payer: Self-pay | Admitting: Internal Medicine

## 2024-06-07 ENCOUNTER — Other Ambulatory Visit: Payer: Self-pay | Admitting: Internal Medicine

## 2024-06-07 DIAGNOSIS — I25118 Atherosclerotic heart disease of native coronary artery with other forms of angina pectoris: Secondary | ICD-10-CM | POA: Insufficient documentation

## 2024-06-09 ENCOUNTER — Ambulatory Visit: Admitting: Neurology

## 2024-06-09 ENCOUNTER — Encounter: Payer: Self-pay | Admitting: Neurology

## 2024-06-09 VITALS — BP 120/77 | HR 120 | Ht 65.0 in | Wt 165.0 lb

## 2024-06-09 DIAGNOSIS — R531 Weakness: Secondary | ICD-10-CM | POA: Diagnosis not present

## 2024-06-09 NOTE — Progress Notes (Signed)
 [facility] Clinic Note - Initial Visit   Date: 06/09/2024   Collin Horne MRN: [id number] DOB: 1966-07-24   Dear Dr. Joshua:  Thank you for your kind referral of Collin Horne for consultation of weakness. Although his history is well known to you, please allow us  to reiterate it for the purpose of our medical record. The patient was accompanied to the clinic by self.    Collin Horne is a 58 y.o. right-handed male with depression, hypertension, hyperlipidemia, and alcohol abuse presenting for evaluation of generalized weakness.   IMPRESSION/PLAN: Generalized weakness.  He with mildly positive AChR binding antibody. His history of his typical for myasthenia gravis which tends to have diurnal variation.  Further, there is no weakness that I can detect on his exam, neither bulbar or in the limbs.  I would like get repetitive nerve stimulation studies on NCS/EMG to better ascertain whether there is evidence of a neuromuscular junction disorder.    Alcohol abuse.  Counseled to consider reducing alcohol consumption.  Further recommendations pending results.   ------------------------------------------------------------- History of present illness: Over the past several months, he has been having generalized weakness and reduced stamina.  He also has shortness of breath with exertion. Symptoms do not fluctuate and are constant.  There is no progressive symptoms.   No difficulty with speech, swallow, double vision, droopy eyelids, or limb weakness.  He sleeps on one pillow.  His PCP ordered AChR binding antibodies to further evaluate his symptoms which returned mildly positive (0.72).  He is here for further evaluation.   Out-side paper records, electronic medical record, and images have been reviewed where available and summarized as:  Lab Results  Component Value Date   HGBA1C 5.0 04/09/2024   Lab Results  Component Value Date   VITAMINB12 649 04/09/2024    Lab Results  Component Value Date   TSH 1.77 04/09/2024   Past Medical History:  Diagnosis Date   Anemia    Anxiety    Blood transfusion without reported diagnosis 10/20/24   Upper GI problem   Depression    Hyperlipidemia    Hypertension    Seizures (HCC)    Sleep apnea    Substance abuse [facility])     Past Surgical History:  Procedure Laterality Date   ANKLE SURGERY  11/12/2005   BIOPSY  10/22/2023   Procedure: BIOPSY;  Surgeon: Legrand Victory LITTIE DOUGLAS, MD;  Location: [facility] ENDOSCOPY;  Service: Gastroenterology;;   COLONOSCOPY     ESOPHAGOGASTRODUODENOSCOPY (EGD) WITH PROPOFOL  N/A 10/22/2023   Procedure: ESOPHAGOGASTRODUODENOSCOPY (EGD) WITH PROPOFOL ;  Surgeon: Legrand Victory LITTIE DOUGLAS, MD;  Location: [facility] ENDOSCOPY;  Service: Gastroenterology;  Laterality: N/A;   FRACTURE SURGERY  09/02/2024   Left ankle   SIGMOIDOSCOPY       Medications:  Outpatient Encounter Medications as of 06/09/2024  Medication Sig Note   atorvastatin  (LIPITOR) 20 MG tablet TAKE 1 TABLET BY MOUTH EVERY DAY    desvenlafaxine (PRISTIQ) 100 MG 24 hr tablet Take 100 mg by mouth daily. 10/21/2023: Pt is adamant he is still taking this medication. Dispense report does not support this claim.    ferrous sulfate  325 (65 FE) MG tablet TAKE 1 TABLET BY MOUTH TWICE A DAY    gabapentin  (NEURONTIN ) 800 MG tablet Take 800 mg by mouth 3 (three) times daily as needed (nerve pain).    lamoTRIgine (LAMICTAL) 100 MG tablet Take 100 mg by mouth daily.    Multiple Vitamins-Minerals (MULTIVITAMIN WITH MINERALS) tablet Take 1 tablet  by mouth daily.    naltrexone  (DEPADE) 50 MG tablet Take 1 tablet (50 mg total) by mouth daily.    pantoprazole  (PROTONIX ) 40 MG tablet TAKE 1 TABLET (40 MG TOTAL) BY MOUTH TWICE A DAY BEFORE MEALS    potassium chloride  (KLOR-CON  10) 10 MEQ tablet Take 1 tablet (10 mEq total) by mouth 2 (two) times daily.    propranolol (INDERAL) 10 MG tablet Take 10 mg by mouth as needed (anxiety).    thiamine  (VITAMIN B1)  100 MG tablet Take 1 tablet (100 mg total) by mouth daily.    traZODone (DESYREL) 50 MG tablet Take 50-100 mg by mouth at bedtime as needed for sleep.    zinc  gluconate 50 MG tablet Take 1 tablet (50 mg total) by mouth daily.    [DISCONTINUED] metoprolol  tartrate (LOPRESSOR ) 100 MG tablet Take tablet (100mg ) TWO hours prior to your cardiac CT scan. (Patient not taking: Reported on 06/09/2024)    No facility-administered encounter medications on file as of 06/09/2024.    Allergies: No Known Allergies  Family History: Family History  Problem Relation Age of Onset   Arthritis Mother    Anxiety disorder Mother    Alcohol abuse Father    Depression Father    Early death Father    Depression Sister    Colon polyps Maternal Grandfather    Heart disease Neg Hx    Hyperlipidemia Neg Hx    Hypertension Neg Hx    Kidney disease Neg Hx    Stroke Neg Hx    Asthma Neg Hx    Colon cancer Neg Hx    Esophageal cancer Neg Hx    Rectal cancer Neg Hx    Stomach cancer Neg Hx     Social History: Social History   Tobacco Use   Smoking status: Former    Current packs/day: 0.00    Average packs/day: 1 pack/day for 30.0 years (30.0 ttl pk-yrs)    Types: Cigarettes, E-cigarettes    Start date: 06/21/1990    Quit date: 06/21/2020    Years since quitting: 3.9   Smokeless tobacco: Never  Vaping Use   Vaping status: Every Day  Substance Use Topics   Alcohol use: Yes    Alcohol/week: 3.0 standard drinks of alcohol    Types: 3 Cans of beer per week    Comment: once a month   Drug use: Never   Social History   Social History Narrative   Are you right handed or left handed? Right handed    Are you currently employed ? No    What is your current occupation?   Do you live at home alone? Yes    Who lives with you?    What type of home do you live in: 1 story or 2 story? Lives in a one story. Side walk to back door is 3 steps. Front door has 6 steps.         Vital Signs:  BP 120/77   Pulse  (!) 120   Ht 5' 5 (1.651 m)   Wt 165 lb (74.8 kg)   SpO2 95%   BMI 27.46 kg/m    Neurological Exam: MENTAL STATUS including orientation to time, place, person, recent and remote memory, attention span and concentration, language, and fund of knowledge is normal.  Speech is not dysarthric.  CRANIAL NERVES: II:  No visual field defects.     III-IV-VI: Pupils equal round and reactive to light.  Normal conjugate, extra-ocular eye movements  in all directions of gaze.  No nystagmus.  No ptosis.   V:  Normal facial sensation.    VII:  Normal facial symmetry and movements.   VIII:  Normal hearing and vestibular function.   IX-X:  Normal palatal movement.   XI:  Normal shoulder shrug and head rotation.   XII:  Normal tongue strength and range of motion, no deviation or fasciculation.  MOTOR:  No atrophy, fasciculations or abnormal movements.  No pronator drift.  No fatigable weakness.  Upper Extremity:  Right  Left  Deltoid  5/5   5/5   Biceps  5/5   5/5   Triceps  5/5   5/5   Wrist extensors  5/5   5/5   Wrist flexors  5/5   5/5   Finger extensors  5/5   5/5   Finger flexors  5/5   5/5   Dorsal interossei  5/5   5/5   Abductor pollicis  5/5   5/5   Tone (Ashworth scale)  0  0   Lower Extremity:  Right  Left  Hip flexors  5/5   5/5   Knee flexors  5/5   5/5   Knee extensors  5/5   5/5   Dorsiflexors  5/5   5/5   Plantarflexors  5/5   5/5   Toe extensors  5/5   5/5   Toe flexors  5/5   5/5   Tone (Ashworth scale)  0  0   MSRs:                                           Right        Left brachioradialis 2+  2+  biceps 2+  2+  triceps 2+  2+  patellar 2+  2+  ankle jerk 2+  2+  Hoffman no  no  plantar response down  down   SENSORY:  Normal and symmetric perception of light touch, pinprick, vibration, and temperature.    COORDINATION/GAIT: Normal finger-to- nose-finger.  Intact rapid alternating movements bilaterally. Gait is mildly wide-based, unassisted.  He is unable to  perform tandem gait.     Thank you for allowing me to participate in patient's care.  If I can answer any additional questions, I would be pleased to do so.    Sincerely,    Dior Stepter K. Tobie, DO

## 2024-06-18 ENCOUNTER — Encounter: Payer: Self-pay | Admitting: Internal Medicine

## 2024-06-24 ENCOUNTER — Other Ambulatory Visit: Payer: Self-pay

## 2024-06-24 ENCOUNTER — Encounter: Payer: Self-pay | Admitting: Internal Medicine

## 2024-06-24 DIAGNOSIS — E785 Hyperlipidemia, unspecified: Secondary | ICD-10-CM

## 2024-06-24 MED ORDER — ATORVASTATIN CALCIUM 20 MG PO TABS: 20.0000 mg | ORAL_TABLET | Freq: Every day | ORAL | 0 refills | Status: DC

## 2024-07-09 ENCOUNTER — Encounter: Payer: Self-pay | Admitting: Internal Medicine

## 2024-07-15 ENCOUNTER — Other Ambulatory Visit: Payer: Self-pay | Admitting: Gastroenterology

## 2024-07-20 ENCOUNTER — Other Ambulatory Visit: Payer: Self-pay | Admitting: Internal Medicine

## 2024-07-20 DIAGNOSIS — I25118 Atherosclerotic heart disease of native coronary artery with other forms of angina pectoris: Secondary | ICD-10-CM

## 2024-07-23 ENCOUNTER — Encounter: Admitting: Neurology

## 2024-08-03 ENCOUNTER — Telehealth: Payer: Self-pay

## 2024-08-03 NOTE — Telephone Encounter (Signed)
 Returned call and LVM. The order had already been placed and linked to the appt. No issues seen. Request call back with concerns. Direct number left.

## 2024-08-03 NOTE — Telephone Encounter (Signed)
 Copied from CRM [id number]. Topic: Clinical - Medication Question >> Aug 03, 2024 12:05 PM Rozanna MATSU wrote: Reason for CRM: Roselie with [facility] imaging [phone] ext [phone] is needing order for pt stated Lung screening ct is scheduled for 10/01 but no order or referral for authorization. CPT code is  [phone] for his yearly screening.  Please advise.

## 2024-08-05 ENCOUNTER — Other Ambulatory Visit: Payer: Self-pay | Admitting: Acute Care

## 2024-08-05 DIAGNOSIS — Z122 Encounter for screening for malignant neoplasm of respiratory organs: Secondary | ICD-10-CM

## 2024-08-05 DIAGNOSIS — Z87891 Personal history of nicotine dependence: Secondary | ICD-10-CM

## 2024-08-05 DIAGNOSIS — F17210 Nicotine dependence, cigarettes, uncomplicated: Secondary | ICD-10-CM

## 2024-08-05 NOTE — Telephone Encounter (Signed)
 Returned call again to [place] [phone]. Advised current order has not expired. Message sent to [facility] to confirm PA is lined to current order not a previously expired order.

## 2024-08-07 NOTE — Telephone Encounter (Signed)
 Copied from CRM #[id number]. Topic: General - Other >> Aug 07, 2024  1:29 PM Collin Horne wrote: Reason for CRM: Collin Horne is returning call for this patient. States nothing is needed at this time.

## 2024-08-12 ENCOUNTER — Ambulatory Visit
Admission: RE | Admit: 2024-08-12 | Discharge: 2024-08-12 | Disposition: A | Discharge status: Home / Self Care | Source: Ambulatory Visit | Attending: Acute Care | Admitting: Acute Care

## 2024-08-12 DIAGNOSIS — Z87891 Personal history of nicotine dependence: Secondary | ICD-10-CM | POA: Diagnosis not present

## 2024-08-12 DIAGNOSIS — Z122 Encounter for screening for malignant neoplasm of respiratory organs: Secondary | ICD-10-CM

## 2024-08-12 DIAGNOSIS — F17210 Nicotine dependence, cigarettes, uncomplicated: Secondary | ICD-10-CM

## 2024-08-12 NOTE — Telephone Encounter (Signed)
 Yes, Collin Horne is good.  Pt was seen.

## 2024-08-16 ENCOUNTER — Other Ambulatory Visit: Payer: Self-pay | Admitting: Internal Medicine

## 2024-08-16 DIAGNOSIS — E876 Hypokalemia: Secondary | ICD-10-CM

## 2024-08-18 ENCOUNTER — Telehealth: Payer: Self-pay

## 2024-08-18 DIAGNOSIS — R911 Solitary pulmonary nodule: Secondary | ICD-10-CM

## 2024-08-18 NOTE — Telephone Encounter (Signed)
 Recent Lung CT results have been reviewed by Rut[place], NP. Please call the patient and review their results. Patient will need a 3 month follow up due to nodule growth. Previously 10.8 mm now 13.7 with a 1.3 mm solid component. Statin therapy noted. Place order. Send results and plan to PCP.  IMPRESSION: Lung-RADS 4A, suspicious. Follow up low-dose chest CT without contrast in 3 months (please use the following order, CT CHEST LCS NODULE FOLLOW-UP W/O CM) is recommended. Alternatively, PET may be considered when there is a solid component 8mm or larger.   Aortic Atherosclerosis (ICD10-I70.0) and Emphysema (ICD10-J43.9).

## 2024-08-18 NOTE — Telephone Encounter (Signed)
 Spoke with patient by phone, using two patient identifiers, to review findings of LDCT.  Results show LR4A with suspicious findings for lung nodule.  Previous area of ground glass has now grown and shows a solid component. Recommendation for repeat LDCT in 3 months.  Patient states he has not noticed any recent increase in chest congestion or any recent illness but he has felt more short of breath recently.  He has noticed some small areas of mold in his home, mostly in the bathroom area.  He also vapes.  Reviewed findings and recommendations as per noted by LCS provider.  Patient is in agreement to repeat scan in 3 months.  He has reviewed results in mychart.  Patient acknowledged understanding of results. Order placed for follow up scan.  Patient does see PCP in 2 weeks and also has upcoming cardiology appointment and will discuss recent shortness of breath.  Also will talk with landlord regarding mold in home.  New order placed for follow up LDCT and results/plan faxed to PCP.

## 2024-08-18 NOTE — Addendum Note (Signed)
 Addended by: DIONISIO AQUAS on: 08/18/2024 11:20 AM   Modules accepted: Orders

## 2024-08-25 ENCOUNTER — Encounter: Payer: Self-pay | Admitting: Internal Medicine

## 2024-08-25 ENCOUNTER — Ambulatory Visit: Admitting: Internal Medicine

## 2024-08-25 VITALS — BP 128/86 | HR 92 | Temp 98.5°F | Resp 16 | Ht 65.0 in | Wt 169.4 lb

## 2024-08-25 DIAGNOSIS — E785 Hyperlipidemia, unspecified: Secondary | ICD-10-CM

## 2024-08-25 DIAGNOSIS — E519 Thiamine deficiency, unspecified: Secondary | ICD-10-CM

## 2024-08-25 DIAGNOSIS — R0609 Other forms of dyspnea: Secondary | ICD-10-CM

## 2024-08-25 DIAGNOSIS — R5381 Other malaise: Secondary | ICD-10-CM

## 2024-08-25 DIAGNOSIS — E876 Hypokalemia: Secondary | ICD-10-CM

## 2024-08-25 DIAGNOSIS — D538 Other specified nutritional anemias: Secondary | ICD-10-CM | POA: Diagnosis not present

## 2024-08-25 DIAGNOSIS — Z125 Encounter for screening for malignant neoplasm of prostate: Secondary | ICD-10-CM

## 2024-08-25 DIAGNOSIS — L219 Seborrheic dermatitis, unspecified: Secondary | ICD-10-CM | POA: Diagnosis not present

## 2024-08-25 MED ORDER — ATORVASTATIN CALCIUM 20 MG PO TABS: 20.0000 mg | ORAL_TABLET | Freq: Every day | ORAL | 0 refills | Status: DC

## 2024-08-25 NOTE — Progress Notes (Signed)
 Subjective:  Patient ID: Collin Horne, male    DOB: 1966/10/15  Age: 58 y.o. MRN: [id number]  CC: Anemia   HPI Collin Horne presents for f/up--  Discussed the use of AI scribe software for clinical note transcription with the patient, who gave verbal consent to proceed.  History of Present Illness Collin Horne is a 58 year old male who presents with shortness of breath and decreased physical activity following an ankle fracture a year ago.  He has experienced significant shortness of breath and decreased physical activity since fracturing his ankle approximately one year ago. His lifestyle is sedentary, and he becomes winded after walking short distances, such as 100 yards from his car to his apartment. He sometimes experiences nausea to the point of vomiting after walking up a slight hill. No chest pain during exertion, but he feels weak and attributes some symptoms to muscular atrophy due to inactivity.  He has a history of smoking but currently vapes. He quit alcohol in July and reports feeling more energetic since then. He takes B1 and B12 supplements, although he occasionally forgets the B1. He denies using THC or street drugs.  A recent CT scan showed a growth from 10 mm to 13 mm, with a follow-up CT scheduled after the new year. He has an upcoming EMG with a neurologist in about three weeks and a cardiologist appointment shortly thereafter. He recalls a previous heart CT showing some calcification but no immediate concerns.  He reports a slight dry cough but denies coughing up blood or phlegm recently. He has gained approximately 20 pounds over the past year but notes no significant changes in appetite.  He mentions a rash for which he has used ketoconazole  with some success in the past.     Outpatient Medications Prior to Visit  Medication Sig Dispense Refill   desvenlafaxine (PRISTIQ) 100 MG 24 hr tablet Take 100 mg by mouth daily.     ferrous sulfate  325 (65 FE)  MG tablet TAKE 1 TABLET BY MOUTH TWICE A DAY 180 tablet 1   gabapentin  (NEURONTIN ) 800 MG tablet Take 800 mg by mouth 3 (three) times daily as needed (nerve pain).     lamoTRIgine (LAMICTAL) 100 MG tablet Take 100 mg by mouth daily.     Multiple Vitamins-Minerals (MULTIVITAMIN WITH MINERALS) tablet Take 1 tablet by mouth daily.     naltrexone  (DEPADE) 50 MG tablet Take 1 tablet (50 mg total) by mouth daily. 90 tablet 0   olmesartan  (BENICAR ) 20 MG tablet Take 20 mg by mouth daily.     pantoprazole  (PROTONIX ) 40 MG tablet TAKE 1 TABLET (40 MG TOTAL) BY MOUTH TWICE A DAY BEFORE MEALS 180 tablet 0   potassium chloride  (KLOR-CON ) 10 MEQ tablet TAKE 1 TABLET BY MOUTH 2 TIMES DAILY. 180 tablet 1   propranolol (INDERAL) 10 MG tablet Take 10 mg by mouth as needed (anxiety).     traZODone (DESYREL) 50 MG tablet Take 50-100 mg by mouth at bedtime as needed for sleep.     zinc  gluconate 50 MG tablet Take 1 tablet (50 mg total) by mouth daily. 90 tablet 1   atorvastatin  (LIPITOR) 20 MG tablet Take 1 tablet (20 mg total) by mouth daily. 90 tablet 0   Zinc  50 MG TABS Take 50 mg by mouth daily.     thiamine  (VITAMIN B1) 100 MG tablet Take 1 tablet (100 mg total) by mouth daily. 90 tablet 1   No facility-administered medications prior to visit.  ROS Review of Systems  Constitutional:  Positive for fatigue. Negative for appetite change, chills, diaphoresis, fever and unexpected weight change.  HENT: Negative.    Eyes: Negative.   Respiratory:  Positive for shortness of breath. Negative for cough, choking and wheezing.   Cardiovascular:  Negative for chest pain, palpitations and leg swelling.  Gastrointestinal:  Negative for abdominal pain, constipation, diarrhea, nausea and vomiting.  Genitourinary: Negative.  Negative for difficulty urinating.  Musculoskeletal:  Positive for arthralgias. Negative for joint swelling and myalgias.  Skin:  Positive for pallor.  Neurological:  Negative for dizziness.   Hematological:  Negative for adenopathy. Does not bruise/bleed easily.  Psychiatric/Behavioral:  Positive for confusion and decreased concentration. Negative for sleep disturbance. The patient is not nervous/anxious.     Objective:  BP 128/86 (BP Location: Left Arm, Patient Position: Sitting, Cuff Size: Normal)   Pulse 92   Temp 98.5 F (36.9 C) (Oral)   Resp 16   Ht 5' 5 (1.651 m)   Wt 169 lb 6.4 oz (76.8 kg)   SpO2 98%   BMI 28.19 kg/m   BP Readings from Last 3 Encounters:  08/25/24 128/86  06/09/24 120/77  06/01/24 131/79    Wt Readings from Last 3 Encounters:  08/25/24 169 lb 6.4 oz (76.8 kg)  06/09/24 165 lb (74.8 kg)  05/19/24 165 lb 3.2 oz (74.9 kg)    Physical Exam Vitals reviewed.  Constitutional:      General: He is not in acute distress.    Appearance: He is ill-appearing. He is not toxic-appearing or diaphoretic.  HENT:     Mouth/Throat:     Mouth: Mucous membranes are moist.  Eyes:     General: No scleral icterus.    Conjunctiva/sclera: Conjunctivae normal.  Cardiovascular:     Rate and Rhythm: Normal rate.     Heart sounds: No murmur heard.    No friction rub. No gallop.     Comments: EKG--- NSR, 87 bpm No LVH, Q waves, or ST/T wave changes  Pulmonary:     Effort: Pulmonary effort is normal.     Breath sounds: No stridor. No wheezing, rhonchi or rales.  Abdominal:     General: Abdomen is flat.     Palpations: There is no mass.     Tenderness: There is no abdominal tenderness. There is no guarding.     Hernia: No hernia is present.  Musculoskeletal:        General: Normal range of motion.     Cervical back: Neck supple.     Right lower leg: No edema.     Left lower leg: No edema.  Lymphadenopathy:     Cervical: No cervical adenopathy.  Skin:    General: Skin is warm.     Coloration: Skin is pale. Skin is not jaundiced.     Findings: No bruising or rash.  Neurological:     General: No focal deficit present.     Mental Status: He is  alert. Mental status is at baseline.  Psychiatric:        Attention and Perception: He is inattentive.        Cognition and Memory: Cognition is impaired. Memory is impaired.     Lab Results  Component Value Date   WBC 5.2 05/19/2024   HGB 11.8 (L) 05/19/2024   HCT 33.9 (L) 05/19/2024   PLT 263.0 05/19/2024   GLUCOSE 116 (H) 05/19/2024   CHOL 113 04/09/2024   TRIG 64.0 04/09/2024  HDL 69.40 04/09/2024   LDLDIRECT 70.0 12/21/2021   LDLCALC 31 04/09/2024   ALT 25 04/09/2024   AST 32 04/09/2024   NA 138 05/19/2024   K 3.2 (L) 05/19/2024   CL 94 (L) 05/19/2024   CREATININE 0.94 05/19/2024   BUN 2 (L) 05/19/2024   CO2 34 (H) 05/19/2024   TSH 1.77 04/09/2024   PSA 0.74 10/24/2022   INR 1.0 10/21/2023   HGBA1C 5.0 04/09/2024    CT CHEST LUNG CA SCREEN LOW DOSE W/O CM Result Date: 08/17/2024 CLINICAL DATA:  Former smoker. Thirty-five pack-year smoking history. Asymptomatic. EXAM: CT CHEST WITHOUT CONTRAST LOW-DOSE FOR LUNG CANCER SCREENING TECHNIQUE: Multidetector CT imaging of the chest was performed following the standard protocol without IV contrast. RADIATION DOSE REDUCTION: This exam was performed according to the departmental dose-optimization program which includes automated exposure control, adjustment of the mA and/or kV according to patient size and/or use of iterative reconstruction technique. COMPARISON:  03/20/2023 FINDINGS: Cardiovascular: Aortic atherosclerosis and coronary artery calcification. Heart size normal. No pericardial effusion. Mediastinum/Nodes: No enlarged mediastinal, hilar, or axillary lymph nodes. Thyroid  gland, trachea, and esophagus demonstrate no significant findings. Lungs/Pleura: Emphysema with diffuse bronchial wall thickening. Part solid nodule within the medial right apex has progressed from the previous exam, image 48/3. On today's study this now appears part solid measuring 13.7 mm with central solid component measuring 1.2 mm. The previous exam  this was entirely ground-glass in appearance measuring 10.8 mm. Calcified granulomas again noted in the left upper lobe. Upper Abdomen: No acute abnormality. Hepatic steatosis. No acute or suspicious osseous findings. Musculoskeletal: Multiple remote right posterior rib fractures. Stable mild compression deformities involving T10 and T11. IMPRESSION: Lung-RADS 4A, suspicious. Follow up low-dose chest CT without contrast in 3 months (please use the following order, CT CHEST LCS NODULE FOLLOW-UP W/O CM) is recommended. Alternatively, PET may be considered when there is a solid component 8mm or larger. Aortic Atherosclerosis (ICD10-I70.0) and Emphysema (ICD10-J43.9). Electronically Signed   By: Waddell Calk M.D.   On: 08/17/2024 13:45    Assessment & Plan:  DOE (dyspnea on exertion)- EKG is normal. Will check a troponin. -     EKG 12-Lead -     Troponin I (High Sensitivity); Future  Dyslipidemia, goal LDL below 70- LDL goal achieved. Doing well on the statin  -     Atorvastatin  Calcium ; Take 1 tablet (20 mg total) by mouth daily.  Dispense: 90 tablet; Refill: 0 -     Hepatic function panel; Future  Anemia due to zinc  deficiency -     CBC with Differential/Platelet; Future  Hypokalemia due to excessive gastrointestinal loss of potassium- Will recheck his K+. -     Basic metabolic panel with GFR; Future -     Magnesium; Future  Anemia due to acquired thiamine  deficiency -     CBC with Differential/Platelet; Future  Prostate cancer screening -     PSA; Future  Seborrheic dermatitis -     Ketoconazole ; Apply 1 Application topically 2 (two) times daily.  Dispense: 60 g; Refill: 3  Physical deconditioning -     Ambulatory referral to Physical Therapy     Follow-up: Return in about 3 months (around 11/25/2024).  Debby Molt, MD

## 2024-08-25 NOTE — Patient Instructions (Signed)

## 2024-08-26 DIAGNOSIS — R5381 Other malaise: Secondary | ICD-10-CM | POA: Insufficient documentation

## 2024-08-26 MED ORDER — KETOCONAZOLE 2 % EX CREA: 1.0000 | TOPICAL_CREAM | Freq: Two times a day (BID) | CUTANEOUS | 3 refills | Status: DC

## 2024-09-07 ENCOUNTER — Encounter: Payer: Self-pay | Admitting: Physical Therapy

## 2024-09-07 ENCOUNTER — Ambulatory Visit: Admitting: Physical Therapy

## 2024-09-07 VITALS — HR 130

## 2024-09-07 DIAGNOSIS — R2689 Other abnormalities of gait and mobility: Secondary | ICD-10-CM

## 2024-09-07 NOTE — Therapy (Signed)
 OUTPATIENT PHYSICAL THERAPY NEURO EVALUATION   Patient Name: Collin Horne MRN: [id number] DOB:[birth year], 58 y.o., male Today's Date: 09/07/2024   PCP: Joshua Debby CROME, MD REFERRING PROVIDER: Joshua Debby CROME, MD  END OF SESSION:  PT End of Session - 09/07/24 1408     Visit Number 1    Authorization Type [place] Medicaid    PT Start Time 1403    Activity Tolerance Patient tolerated treatment well    Behavior During Therapy [facility] for tasks assessed/performed          Past Medical History:  Diagnosis Date   Anemia    Anxiety    Blood transfusion without reported diagnosis 10/20/24   Upper GI problem   Depression    Hyperlipidemia    Hypertension    Seizures (HCC)    Sleep apnea    Substance abuse [facility])    Past Surgical History:  Procedure Laterality Date   ANKLE SURGERY  11/12/2005   BIOPSY  10/22/2023   Procedure: BIOPSY;  Surgeon: Legrand Victory CROME DOUGLAS, MD;  Location: [facility] ENDOSCOPY;  Service: Gastroenterology;;   COLONOSCOPY     ESOPHAGOGASTRODUODENOSCOPY (EGD) WITH PROPOFOL  N/A 10/22/2023   Procedure: ESOPHAGOGASTRODUODENOSCOPY (EGD) WITH PROPOFOL ;  Surgeon: Legrand Victory CROME DOUGLAS, MD;  Location: [facility] ENDOSCOPY;  Service: Gastroenterology;  Laterality: N/A;   FRACTURE SURGERY  09/02/2024   Left ankle   SIGMOIDOSCOPY     Patient Active Problem List   Diagnosis Date Noted   Physical deconditioning 08/26/2024   Coronary artery disease of native artery of native heart with stable angina pectoris 06/07/2024   Alcohol abuse 05/19/2024   Hypokalemia due to excessive gastrointestinal loss of potassium 05/19/2024   Anemia due to zinc  deficiency 04/13/2024   Chronic hyperglycemia 03/03/2024   PUD (peptic ulcer disease) 10/22/2023   Gastroesophageal reflux disease with esophagitis without hemorrhage 10/22/2023   Chronic gastric ulcer with hemorrhage 10/22/2023   Anemia due to acquired thiamine  deficiency 04/08/2023   Dyslipidemia, goal LDL below 70 12/25/2022   Hypertension  12/24/2022   Low testosterone  in male 10/02/2022   Erectile dysfunction due to arterial insufficiency 04/12/2022   Prostate cancer screening 12/21/2021   Nuclear sclerosis of both eyes 02/05/2020   Vitamin B12 deficiency anemia due to intrinsic factor deficiency 09/03/2018   Moderate episode of recurrent major depressive disorder (HCC) 07/08/2018   Uncomplicated alcohol dependence (HCC) 07/08/2018   Pure hyperglyceridemia 07/16/2017   Facial dermatitis 07/16/2017   Seborrheic dermatitis 06/17/2017   DDD (degenerative disc disease), cervical 06/17/2017   Tobacco abuse 04/10/2016    ONSET DATE: 08/26/2024 (referral)   REFERRING DIAG: R53.81 (ICD-10-CM) - Physical deconditioning  THERAPY DIAG:  No diagnosis found.  Rationale for Evaluation and Treatment: Rehabilitation  SUBJECTIVE:  SUBJECTIVE STATEMENT: Pt presents alone, no AD. States he gets winded after very minor exertion. Has not been able to work for about a year due to an ankle fracture. Has been sitting around for about a year due to waiting for ankle to heal and then SOB w/very little activity. Sees the cardiologist next week to rule out cardiac involvement.   Has not had any falls but occasionally feels as though his legs are going to give out on him and he needs to sit down. Has a hard time standing back up when this happens. Occasionally gets so fatigued that he will vomit.   Has orthostatic hypotension and has to be very careful to not move too quickly.    Pt accompanied by: self  PERTINENT HISTORY: depression, hypertension, hyperlipidemia, and alcohol abuse  PAIN:  Are you having pain? No  PRECAUTIONS: Fall  RED FLAGS: None   WEIGHT BEARING RESTRICTIONS: No  FALLS: Has patient fallen in last 6 months? No  LIVING  ENVIRONMENT: Lives with: lives alone Lives in: House/apartment Stairs: Yes: External: 3 in back and 5-6 in front steps; on right going up, on left going up, and can reach both Has following equipment at home: Single point cane and Walker - 2 wheeled  PLOF: Independent  PATIENT GOALS: I guess I need you guys to kinda assess what I need   OBJECTIVE:  Note: Objective measures were completed at Evaluation unless otherwise noted.  DIAGNOSTIC FINDINGS: CT of chest from 08/12/24  FINDINGS: Cardiovascular: Aortic atherosclerosis and coronary artery calcification. Heart size normal. No pericardial effusion.   Mediastinum/Nodes: No enlarged mediastinal, hilar, or axillary lymph nodes. Thyroid  gland, trachea, and esophagus demonstrate no significant findings.   Lungs/Pleura: Emphysema with diffuse bronchial wall thickening. Part solid nodule within the medial right apex has progressed from the previous exam, image 48/3. On today's study this now appears part solid measuring 13.7 mm with central solid component measuring 1.2 mm. The previous exam this was entirely ground-glass in appearance measuring 10.8 mm. Calcified granulomas again noted in the left upper lobe.   Upper Abdomen: No acute abnormality. Hepatic steatosis. No acute or suspicious osseous findings.   Musculoskeletal: Multiple remote right posterior rib fractures. Stable mild compression deformities involving T10 and T11.  COGNITION: Overall cognitive status: Impaired and Difficulty to assess due to: no family present   SENSATION: Pt denies numbness/tingling in BLEs  COORDINATION: ***   POSTURE: rounded shoulders, forward head, and increased thoracic kyphosis  LOWER EXTREMITY ROM:     Active  Right Eval Left Eval  Hip flexion    Hip extension    Hip abduction    Hip adduction    Hip internal rotation    Hip external rotation    Knee flexion    Knee extension    Ankle dorsiflexion    Ankle  plantarflexion    Ankle inversion    Ankle eversion     (Blank rows = not tested)  LOWER EXTREMITY MMT:    MMT Right Eval Left Eval  Hip flexion    Hip extension    Hip abduction    Hip adduction    Hip internal rotation    Hip external rotation    Knee flexion    Knee extension    Ankle dorsiflexion    Ankle plantarflexion    Ankle inversion    Ankle eversion    (Blank rows = not tested)  BED MOBILITY:  Not tested  TRANSFERS: Sit to stand: {Levels  of assistance:24026}  Assistive device utilized: {Assistive devices:23999}     Stand to sit: {Levels of assistance:24026}  Assistive device utilized: {Assistive devices:23999}      RAMP:  Not tested  CURB:  Not tested  STAIRS: Not tested GAIT: Gait pattern: {gait characteristics:25376} Distance walked: *** Assistive device utilized: None Level of assistance: Modified independence Comments: ***   FUNCTIONAL TESTS:  {Functional tests:24029}  PATIENT SURVEYS:  {rehab surveys:24030}  VITALS  Vitals:   09/07/24 1434  Pulse: (!) 130  SpO2: 97% Comment: Room air                                                                                                                                 TREATMENT DATE: ***    PATIENT EDUCATION: Education details: *** Person educated: {Person educated:25204} Education method: {Education Method:25205} Education comprehension: {Education Comprehension:25206}  HOME EXERCISE PROGRAM: ***  GOALS: Goals reviewed with patient? Yes  SHORT TERM GOALS: Target date: ***  *** Baseline: Goal status: INITIAL  2.  *** Baseline:  Goal status: INITIAL  3.  *** Baseline:  Goal status: INITIAL  4.  *** Baseline:  Goal status: INITIAL  5.  *** Baseline:  Goal status: INITIAL  6.  *** Baseline:  Goal status: INITIAL  LONG TERM GOALS: Target date: ***  *** Baseline:  Goal status: INITIAL  2.  *** Baseline:  Goal status: INITIAL  3.  *** Baseline:  Goal  status: INITIAL  4.  *** Baseline:  Goal status: INITIAL  5.  *** Baseline:  Goal status: INITIAL  6.  *** Baseline:  Goal status: INITIAL  ASSESSMENT:  CLINICAL IMPRESSION: Patient is a 58 year old male referred to Neuro OPPT for physical deconditioning. Pt's PMH is significant for: depression, hypertension, hyperlipidemia, and alcohol abuse. The following deficits were present during the exam: ***. Based on ***, pt is an incr risk for falls. Pt would benefit from skilled PT to address these impairments and functional limitations to maximize functional mobility independence   OBJECTIVE IMPAIRMENTS: {opptimpairments:25111}.   ACTIVITY LIMITATIONS: {activitylimitations:27494}  PARTICIPATION LIMITATIONS: {participationrestrictions:25113}  PERSONAL FACTORS: {Personal factors:25162} are also affecting patient's functional outcome.   REHAB POTENTIAL: {rehabpotential:25112}  CLINICAL DECISION MAKING: {clinical decision making:25114}  EVALUATION COMPLEXITY: {Evaluation complexity:25115}  PLAN:  PT FREQUENCY: {rehab frequency:25116}  PT DURATION: {rehab duration:25117}  PLANNED INTERVENTIONS: {rehab planned interventions:25118::97110-Therapeutic exercises,97530- Therapeutic [phone]- Neuromuscular re-education,97535- Self Rjmz,02859- Manual therapy,Patient/Family education}  PLAN FOR NEXT SESSION: ***   Rashel Okeefe E Polette Nofsinger, PT, DPT 09/07/2024, 2:09 PM

## 2024-09-17 ENCOUNTER — Ambulatory Visit: Admitting: Neurology

## 2024-09-17 DIAGNOSIS — R531 Weakness: Secondary | ICD-10-CM

## 2024-09-17 NOTE — Procedures (Signed)
 [facility]  [address] [place], [address]  [place], [place] [address] Tel: [phone] Fax: [phone] Test Date:  09/17/2024  Patient: Collin Horne DOB: 10/25/1966 Physician: Tonita Blanch, DO  Sex: Male Height: 5' 5 Ref Phys: Tonita Blanch, DO  ID#: [id number]   Technician:    History: This is a 58 year old man referred for evaluation of generalized weakness.  NCV & EMG Findings: Extensive electrodiagnostic testing of the right upper and lower extremities with repetitive nerve stimulation shows:  Right median, sural, and superficial peroneal sensory responses are within normal limits. Right median and peroneal motor responses are within normal limits. Repetitive nerve stimulation at 3 Hz of the spinal accessory, median, and peroneal nerves recording at the trapezius, abductor pollicis brevis, and tibialis anterior, respectively, is within normal limits.  Specifically, there is no abnormal decrement.  Additionally, there is no incremental amplitude change with post-facilitation. There is no evidence of active or chronic motor axonal loss changes affecting any of the tested muscles.  Motor unit configuration and recruitment pattern is within normal limits.  Impression: This is a normal study of the right upper and lower extremities.  In particular, there is no evidence of a neuromuscular junction disorder, diffuse myopathy, sensorimotor polyneuropathy, or cervical/lumbosacral radiculopathy.   ___________________________ Tonita Blanch, DO    Nerve Conduction Studies   Stim Site NR Peak (ms) Norm Peak (ms) O-P Amp (V) Norm O-P Amp  Right Median Anti Sensory (2nd Digit)  32 C  Wrist    2.8 <3.6 33.4 >15  Right Sup Peroneal Anti Sensory (Ant Lat Mall)  32 C  12 cm    2.6 <4.6 7.2 >4  Right Sural Anti Sensory (Lat Mall)  32 C  Calf    2.5 <4.6 21.1 >4     Stim Site NR Onset (ms) Norm Onset (ms) O-P Amp (mV) Norm O-P Amp Site1 Site2 Delta-0 (ms) Dist (cm) Vel  (m/s) Norm Vel (m/s)  Right Median Motor (Abd Poll Brev)  32 C  Wrist    2.9 <4.0 8.4 >6 Elbow Wrist 5.0 31.0 62 >50  Elbow    7.9  8.2         Right Peroneal Motor (Ext Dig Brev)  32 C  Ankle    3.4 <6.0 4.3 >2.5 B Fib Ankle 7.3 38.0 52 >40  B Fib    10.7  4.1  Poplt B Fib 1.4 7.0 50 >40  Poplt    12.1  4.0         Right Peroneal TA Motor (Tib Ant)  32 C  Fib Head    2.2 <4.5 5.4 >3 Poplit Fib Head 1.5 7.0 47 >40  Poplit    3.7 <5.7 5.4          Electromyography   Side Muscle Ins.Act Fibs Fasc Recrt Amp Dur Poly Activation Comment  Right AntTibialis Nml Nml Nml Nml Nml Nml Nml Nml N/A  Right Gastroc Nml Nml Nml Nml Nml Nml Nml Nml N/A  Right RectFemoris Nml Nml Nml Nml Nml Nml Nml Nml N/A  Right BicepsFemS Nml Nml Nml Nml Nml Nml Nml Nml N/A  Right GluteusMed Nml Nml Nml Nml Nml Nml Nml Nml N/A  Right 1stDorInt Nml Nml Nml Nml Nml Nml Nml Nml N/A  Right BrachioRad Nml Nml Nml Nml Nml Nml Nml Nml N/A  Right PronatorTeres Nml Nml Nml Nml Nml Nml Nml Nml N/A  Right Biceps Nml Nml Nml Nml Nml Nml Nml Nml N/A  Right  Triceps Nml Nml Nml Nml Nml Nml Nml Nml N/A  Right Deltoid Nml Nml Nml Nml Nml Nml Nml Nml N/A      Waveforms:

## 2024-09-23 ENCOUNTER — Ambulatory Visit: Admitting: Neurology

## 2024-09-23 ENCOUNTER — Encounter: Payer: Self-pay | Admitting: Neurology

## 2024-09-23 ENCOUNTER — Ambulatory Visit: Attending: Student in an Organized Health Care Education/Training Program | Admitting: Internal Medicine

## 2024-09-23 ENCOUNTER — Encounter: Payer: Self-pay | Admitting: Internal Medicine

## 2024-09-23 VITALS — BP 93/65 | HR 137 | Ht 65.0 in | Wt 170.0 lb

## 2024-09-23 VITALS — BP 100/70 | HR 131 | Ht 65.0 in | Wt 170.0 lb

## 2024-09-23 DIAGNOSIS — R000 Tachycardia, unspecified: Secondary | ICD-10-CM | POA: Insufficient documentation

## 2024-09-23 DIAGNOSIS — R2681 Unsteadiness on feet: Secondary | ICD-10-CM

## 2024-09-23 DIAGNOSIS — R292 Abnormal reflex: Secondary | ICD-10-CM

## 2024-09-23 DIAGNOSIS — R0609 Other forms of dyspnea: Secondary | ICD-10-CM | POA: Insufficient documentation

## 2024-09-23 DIAGNOSIS — E785 Hyperlipidemia, unspecified: Secondary | ICD-10-CM | POA: Insufficient documentation

## 2024-09-23 DIAGNOSIS — F1010 Alcohol abuse, uncomplicated: Secondary | ICD-10-CM | POA: Diagnosis present

## 2024-09-23 DIAGNOSIS — Z720 Tobacco use: Secondary | ICD-10-CM | POA: Insufficient documentation

## 2024-09-23 DIAGNOSIS — R911 Solitary pulmonary nodule: Secondary | ICD-10-CM | POA: Diagnosis present

## 2024-09-23 MED ORDER — ATORVASTATIN CALCIUM 20 MG PO TABS: 20.0000 mg | ORAL_TABLET | Freq: Every day | ORAL | 5 refills | Status: DC

## 2024-09-23 MED ORDER — ASPIRIN 81 MG PO TBEC: 81.0000 mg | DELAYED_RELEASE_TABLET | Freq: Every day | ORAL | 12 refills | Status: AC

## 2024-09-23 NOTE — Patient Instructions (Signed)
 Medication Instructions:  START Aspirin 81 mg once daily   CONTINUE Lipitor 20 mg once daily  Testing/Procedures: Your physician has requested that you have an echocardiogram. Echocardiography is a painless test that uses sound waves to create images of your heart. It provides your doctor with information about the size and shape of your heart and how well your heart's chambers and valves are working. This procedure takes approximately one hour. There are no restrictions for this procedure. Please do NOT wear cologne, perfume, aftershave, or lotions (deodorant is allowed). Please arrive 15 minutes prior to your appointment time.  Please note: We ask at that you not bring children with you during ultrasound (echo/ vascular) testing. Due to room size and safety concerns, children are not allowed in the ultrasound rooms during exams. Our front office staff cannot provide observation of children in our lobby area while testing is being conducted. An adult accompanying a patient to their appointment will only be allowed in the ultrasound room at the discretion of the ultrasound technician under special circumstances. We apologize for any inconvenience.   Follow-Up: At [facility], you and your health needs are our priority.  As part of our continuing mission to provide you with exceptional heart care, our providers are all part of one team.  This team includes your primary Cardiologist (physician) and Advanced Practice Providers or APPs (Physician Assistants and Nurse Practitioners) who all work together to provide you with the care you need, when you need it.  Your next appointment:   6 month(s)  Provider:   Emeline FORBES Calender, MD     OTHER INFORMATION:  - Okay to go for physical therapy - INCREASE food and water  intake  - DECREASE alcohol intake

## 2024-09-23 NOTE — Progress Notes (Signed)
 Cardiology Office Note   Date:  09/23/2024  ID:  Whitman Meinhardt, DOB October 27, 1966, MRN [id number] PCP: Joshua Debby CROME, MD  [facility] Providers Cardiologist:  Emeline FORBES Calender, MD     History of Present Illness Khaliel Morey is a 58 y.o. male with past medical history of dyslipidemia, tobacco use, alcohol abuse who was referred by his PCP for an elevated coronary calcium  score and dyspnea on exertion.  States that about a year ago he broke his ankle and since that time has been very sedentary.  Over the past 8 months he picked up alcohol abuse again initially drinking 9 glasses of malt liquor and has been trying to wean down.  Currently drinking 5 glasses of malt liquor each evening.  Last night he started feeling some withdrawal symptoms which include shakiness and started having a drink.  His last sip was last night around 11:00.  Over this period of time he has been very short of breath with minimal exertion, even just walking to the kitchen and back.  Has had very poor p.o. intake other than alcohol and has been trying to increase his water  intake.  He does not eat much food and can go for several days without eating.  Sometimes, he will stand up and feels like his legs are very wobbly but has never had presyncope or syncopal symptoms.  He does not have any chest pain or lower extremity swelling  Tobacco use: Previously smoked 1 pack/day for 35 years and transferred to vaping for the past 5 years     ROS:  Review of Systems  All other systems reviewed and are negative.   Physical Exam  Physical Exam Vitals and nursing note reviewed.  Constitutional:      Appearance: Normal appearance.  HENT:     Head: Normocephalic and atraumatic.  Eyes:     Conjunctiva/sclera: Conjunctivae normal.  Neck:     Vascular: No carotid bruit.  Cardiovascular:     Rate and Rhythm: Regular rhythm. Tachycardia present.  Pulmonary:     Effort: Pulmonary effort is normal.     Breath  sounds: Normal breath sounds.  Musculoskeletal:        General: No swelling or tenderness.  Skin:    Coloration: Skin is not jaundiced or pale.  Neurological:     Mental Status: He is alert.     VS:  BP 100/70   Pulse (!) 131   Ht 5' 5 (1.651 m)   Wt 170 lb (77.1 kg)   BMI 28.29 kg/m         Wt Readings from Last 3 Encounters:  09/23/24 170 lb (77.1 kg)  08/25/24 169 lb 6.4 oz (76.8 kg)  06/09/24 165 lb (74.8 kg)     EKG Interpretation Date/Time:  Wednesday September 23 2024 08:17:23 EST Ventricular Rate:  116 PR Interval:  126 QRS Duration:  68 QT Interval:  316 QTC Calculation: 439 R Axis:   -18  Text Interpretation: Sinus tachycardia Low voltage QRS When compared with ECG of 21-Oct-2023 08:50, No significant change since Confirmed by Calender Emeline [phone]) on 09/23/2024 8:22:20 AM    Studies Reviewed   Coronary CTA 06/01/2024:  1. Coronary calcium  score of 347. This was 91st percentile for age-, sex, and race-matched controls.   2. Total plaque volume 318 mm3 which is percentile for age- and sex-matched controls (calcified plaque 13mm3; non-calcified plaque 229mm3). TPV is severe.   3. Normal coronary origin with left dominance.  4. Moderate atherosclerosis: 50-69% Prox to mid LAD/Prox D1/ostial OM1. FFR for above: 1. Left Main: No significant stenosis. Distal 0.99.   2. LAD: No significant stenosis. Distal 0.80.   3. LCX: No significant stenosis. Distal 0.95.  Distal PDA 0.91.   4. RCA: No significant stenosis. Distal 0.93.  ETT 03/23/21: Poor exercise capacity 2 mins 21 sec, 4.6 METS nondiagnostic, only achieved 78% MHR  Risk Assessment/Calculations             ASCVD risk score: The ASCVD Risk score (Arnett DK, et al., 2019) failed to calculate for the following reasons:   The valid total cholesterol range is 130 to 320 mg/dL   ASSESSMENT  Dyspnea on exertion unlikely related to CAD as he has nonobstructive disease.  Possibly due to  undiagnosed alcoholic cardiomyopathy in combination with deconditioning.  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  He is taking Lipitor consistently Alcohol abuse trying to titrate down and wean off, currently drinks 5 glasses of malt liquor daily.  Strongly advised cessation Tobacco use advised cessation Borderline low thiamine  Pulmonary nodule   Plan  Advised to start taking aspirin 81 mg daily and continue statin Will check an echocardiogram to evaluate for alcoholic cardiomyopathy, especially since he has borderline low thiamine  levels which can cause cardiomyopathy and is related to his alcohol use Strongly advised to increase his caloric and water  intake Strongly advised alcoholic cessation as this is likely a driving factor for many of his underlying conditions and consider discussion with his PCP or seeing a substance abuse counselor for pharmacologic taper He is able to participate in physical therapy from a cardiac standpoint and this will likely doing well Would consider referral to pulmonology for discussion regarding his pulmonary nodule and emphysematous changes on CT Cardiac risk counseling and prevention recommendations: Heart healthy/Mediterranean diet with whole grains, fruits, vegetable, fish, lean meats, nuts, and olive oil. Limit salt. Moderate walking, 3-5 times/week for 30-50 minutes each session. Aim for at least 150 minutes.week. Goal should be pace of 3 miles/hour, or walking 1.5 miles in 30 minutes Avoidance of tobacco products. Avoid excess alcohol.  Follow up: 6 months or sooner pending above workup          Signed, Emeline FORBES Calender, MD

## 2024-09-23 NOTE — Progress Notes (Signed)
 Follow-up Visit   Date: 09/23/2024    Collin Horne MRN: [id number] DOB: 27-Sep-1966    Collin Horne is a 58 y.o. right-handed Caucasian male with depression, hypertension, hyperlipidemia, and alcohol abuse returning to the clinic for follow-up of weakness.  The patient was accompanied to the clinic by self.   IMPRESSION/PLAN: Generalized weakness, most likely due to deconditioning and possibly contributed by vitamin deficiency in the setting of alcohol use.  With his NCS/EMG showing no evidence of neuromuscular junction disorder, I do not think that his mildly abnormal AChR binding antibody is indicative of a disease state.  If he develops more fluctuating weakness or bulbar symptoms, this can be revisited.    - Check vitamin B12, folate, and vitamin B1  2.  In the meantime, he does brisk reflexes throughout and reports episodic jerks, spells of pain, and leg unsteadiness.  I recommend looking at cervical and lumbar spine to evaluate for compressive stenosis.   - MRI cervical and lumbar spine wo contrast  3.  Alcohol use  - Recommend abstaining from alcohol  Further recommendations pending results.   --------------------------------------------- History of present illness: Over the past several months, he has been having generalized weakness and reduced stamina. He also has shortness of breath with exertion. Symptoms do not fluctuate and are constant. There is no progressive symptoms. No difficulty with speech, swallow, double vision, droopy eyelids, or limb weakness. He sleeps on one pillow. His PCP ordered AChR binding antibodies to further evaluate his symptoms which returned mildly positive (0.72). He is here for further evaluation.   UPDATE 09/23/2024:  He was last seen in July and here for follow-up visit.  His EMG was rescheduled by patient and completed last week which did not show evidence of neuromuscular junction disorder, myopathy, or neuropathy.  He continues to  have generalized weakness, which is constant.  No double vision, ptosis, difficulty with speech/swallow, or difficulty with raising the arms and legs.  Sometimes, he feels that his legs feel wobbly and has episodic muscle jerks. Sometimes, he has shooting pain the left arm.  He denies neck or back pain.  Over the past year, he has been drinking alcohol regularly and has cut back to 5 liquor drinks nightly where has he was drinking 12.  He is trying to cut back on this more.    Medications:  Current Outpatient Medications on File Prior to Visit  Medication Sig Dispense Refill   aspirin EC 81 MG tablet Take 1 tablet (81 mg total) by mouth daily. Swallow whole. 30 tablet 12   atorvastatin  (LIPITOR) 20 MG tablet Take 1 tablet (20 mg total) by mouth daily. 90 tablet 5   desvenlafaxine (PRISTIQ) 100 MG 24 hr tablet Take 100 mg by mouth daily.     gabapentin  (NEURONTIN ) 800 MG tablet Take 800 mg by mouth 3 (three) times daily as needed (nerve pain).     ketoconazole  (NIZORAL ) 2 % cream Apply 1 Application topically 2 (two) times daily. 60 g 3   lamoTRIgine (LAMICTAL) 100 MG tablet Take 100 mg by mouth daily.     Multiple Vitamins-Minerals (MULTIVITAMIN WITH MINERALS) tablet Take 1 tablet by mouth daily.     traZODone (DESYREL) 50 MG tablet Take 50-100 mg by mouth at bedtime as needed for sleep.     ferrous sulfate  325 (65 FE) MG tablet TAKE 1 TABLET BY MOUTH TWICE A DAY (Patient not taking: Reported on 09/23/2024) 180 tablet 1   naltrexone  (DEPADE) 50 MG tablet  Take 1 tablet (50 mg total) by mouth daily. (Patient not taking: Reported on 09/23/2024) 90 tablet 0   olmesartan  (BENICAR ) 20 MG tablet Take 20 mg by mouth daily. (Patient not taking: Reported on 09/23/2024)     pantoprazole  (PROTONIX ) 40 MG tablet TAKE 1 TABLET (40 MG TOTAL) BY MOUTH TWICE A DAY BEFORE MEALS (Patient not taking: Reported on 09/23/2024) 180 tablet 0   potassium chloride  (KLOR-CON ) 10 MEQ tablet TAKE 1 TABLET BY MOUTH 2 TIMES DAILY.  (Patient not taking: Reported on 09/23/2024) 180 tablet 1   propranolol (INDERAL) 10 MG tablet Take 10 mg by mouth as needed (anxiety). (Patient not taking: Reported on 09/23/2024)     zinc  gluconate 50 MG tablet Take 1 tablet (50 mg total) by mouth daily. (Patient not taking: Reported on 09/23/2024) 90 tablet 1   No current facility-administered medications on file prior to visit.    Allergies: No Known Allergies  Vital Signs:  BP 93/65   Pulse (!) 137   Ht 5' 5 (1.651 m)   Wt 170 lb (77.1 kg)   SpO2 96%   BMI 28.29 kg/m    Neurological Exam: MENTAL STATUS including orientation to time, place, person, recent and remote memory, attention span and concentration, language, and fund of knowledge is normal.  Speech is not dysarthric.  CRANIAL NERVES:  No visual field defects.  Pupils equal round and reactive to light.  Normal conjugate, extra-ocular eye movements in all directions of gaze.  No ptosis.  Face is symmetric. Palate elevates symmetrically.  Tongue is midline. Facial muscles are 5/5.   MOTOR:  Motor strength is 5/5 in all extremities, no limb fatigability.  No atrophy, fasciculations or abnormal movements.  No pronator drift.  Tone is normal.    MSRs:  Reflexes are 3+/4 throughout and 2+4 at the ankles.  SENSORY:  Intact to vibration throughout.  COORDINATION/GAIT:  Normal finger-to- nose-finger.  Intact rapid alternating movements bilaterally.  Gait narrow based and stable.   Data: NCS/EMG of the right side 09/17/2024:  This is a normal study of the right upper and lower extremities. In particular, there is no evidence of a neuromuscular junction disorder, diffuse myopathy, sensorimotor polyneuropathy, or cervical/lumbosacral radiculopathy.    Thank you for allowing me to participate in patient's care.  If I can answer any additional questions, I would be pleased to do so.    Sincerely,    Ajahni Nay K. Tobie, DO

## 2024-09-28 ENCOUNTER — Ambulatory Visit: Payer: Self-pay | Attending: Internal Medicine | Admitting: Physical Therapy

## 2024-09-28 NOTE — Therapy (Incomplete)
 OUTPATIENT PHYSICAL THERAPY NEURO EVALUATION - ARRIVED NO CHARGE   Patient Name: Collin Horne MRN: [id number] DOB:May 01, 1966, 58 y.o., male Today's Date: 09/28/2024   PCP: Joshua Debby CROME, MD REFERRING PROVIDER: Joshua Debby CROME, MD  END OF SESSION:    Past Medical History:  Diagnosis Date   Anemia    Anxiety    Blood transfusion without reported diagnosis 10/20/24   Upper GI problem   Depression    Hyperlipidemia    Hypertension    Seizures (HCC)    Sleep apnea    Substance abuse [facility])    Past Surgical History:  Procedure Laterality Date   ANKLE SURGERY  11/12/2005   BIOPSY  10/22/2023   Procedure: BIOPSY;  Surgeon: Legrand Victory CROME DOUGLAS, MD;  Location: [facility] ENDOSCOPY;  Service: Gastroenterology;;   COLONOSCOPY     ESOPHAGOGASTRODUODENOSCOPY (EGD) WITH PROPOFOL  N/A 10/22/2023   Procedure: ESOPHAGOGASTRODUODENOSCOPY (EGD) WITH PROPOFOL ;  Surgeon: Legrand Victory CROME DOUGLAS, MD;  Location: [facility] ENDOSCOPY;  Service: Gastroenterology;  Laterality: N/A;   FRACTURE SURGERY  09/02/2024   Left ankle   SIGMOIDOSCOPY     Patient Active Problem List   Diagnosis Date Noted   Physical deconditioning 08/26/2024   Coronary artery disease of native artery of native heart with stable angina pectoris 06/07/2024   Alcohol abuse 05/19/2024   Hypokalemia due to excessive gastrointestinal loss of potassium 05/19/2024   Anemia due to zinc  deficiency 04/13/2024   Chronic hyperglycemia 03/03/2024   PUD (peptic ulcer disease) 10/22/2023   Gastroesophageal reflux disease with esophagitis without hemorrhage 10/22/2023   Chronic gastric ulcer with hemorrhage 10/22/2023   Anemia due to acquired thiamine  deficiency 04/08/2023   Dyslipidemia, goal LDL below 70 12/25/2022   Hypertension 12/24/2022   Low testosterone  in male 10/02/2022   Erectile dysfunction due to arterial insufficiency 04/12/2022   Prostate cancer screening 12/21/2021   Nuclear sclerosis of both eyes 02/05/2020   Vitamin B12 deficiency  anemia due to intrinsic factor deficiency 09/03/2018   Moderate episode of recurrent major depressive disorder (HCC) 07/08/2018   Uncomplicated alcohol dependence (HCC) 07/08/2018   Pure hyperglyceridemia 07/16/2017   Facial dermatitis 07/16/2017   Seborrheic dermatitis 06/17/2017   DDD (degenerative disc disease), cervical 06/17/2017   Tobacco abuse 04/10/2016    ONSET DATE: 08/26/2024 (referral)   REFERRING DIAG: R53.81 (ICD-10-CM) - Physical deconditioning  THERAPY DIAG:  No diagnosis found.  Rationale for Evaluation and Treatment: Rehabilitation  SUBJECTIVE:  SUBJECTIVE STATEMENT: Pt presents alone, no AD. States he gets winded after very minor exertions. Has not been able to work for about a year due to an ankle fracture that he sustained last year and then he became sedentary due to DOE. Has seen neurologist and PCP with no answers. Sees the cardiologist next week to rule out cardiac involvement. States his recent lung CT did show a mass on his lungs but was told not to worry about it. Is not scheduled to see a pulmonologist.   Has not had any falls but occasionally feels as though his legs are going to give out on him and he needs to sit down. Has a hard time standing back up when this happens. Occasionally gets so fatigued that he will vomit, cannot remember if he told his MD this.   Has orthostatic hypotension and has to be very careful to not move too quickly.    Pt accompanied by: self  PERTINENT HISTORY: depression, hypertension, hyperlipidemia and alcohol abuse  PAIN:  Are you having pain? No  PRECAUTIONS: Fall  RED FLAGS: None   WEIGHT BEARING RESTRICTIONS: No  FALLS: Has patient fallen in last 6 months? No  LIVING ENVIRONMENT: Lives with: lives alone Lives in:  House/apartment Stairs: Yes: External: 3 in back and 5-6 in front steps; on right going up, on left going up, and can reach both Has following equipment at home: Single point cane and Walker - 2 wheeled  PLOF: Independent  PATIENT GOALS: I guess I need you guys to kinda assess what I need   OBJECTIVE:  Note: Objective measures were completed at Evaluation unless otherwise noted.  DIAGNOSTIC FINDINGS: CT of chest from 08/12/24  FINDINGS: Cardiovascular: Aortic atherosclerosis and coronary artery calcification. Heart size normal. No pericardial effusion.   Mediastinum/Nodes: No enlarged mediastinal, hilar, or axillary lymph nodes. Thyroid  gland, trachea, and esophagus demonstrate no significant findings.   Lungs/Pleura: Emphysema with diffuse bronchial wall thickening. Part solid nodule within the medial right apex has progressed from the previous exam, image 48/3. On today's study this now appears part solid measuring 13.7 mm with central solid component measuring 1.2 mm. The previous exam this was entirely ground-glass in appearance measuring 10.8 mm. Calcified granulomas again noted in the left upper lobe.   Upper Abdomen: No acute abnormality. Hepatic steatosis. No acute or suspicious osseous findings.   Musculoskeletal: Multiple remote right posterior rib fractures. Stable mild compression deformities involving T10 and T11.  COGNITION: Overall cognitive status: Within functional limits for tasks assessed   SENSATION: Pt denies numbness/tingling in BLEs    POSTURE: rounded shoulders, forward head, and increased thoracic kyphosis  LOWER EXTREMITY ROM:     Active  Right Eval Left Eval  Hip flexion    Hip extension    Hip abduction    Hip adduction    Hip internal rotation    Hip external rotation    Knee flexion    Knee extension    Ankle dorsiflexion    Ankle plantarflexion    Ankle inversion    Ankle eversion     (Blank rows = not tested)  LOWER  EXTREMITY MMT:    MMT Right Eval Left Eval  Hip flexion    Hip extension    Hip abduction    Hip adduction    Hip internal rotation    Hip external rotation    Knee flexion    Knee extension    Ankle dorsiflexion    Ankle plantarflexion  Ankle inversion    Ankle eversion    (Blank rows = not tested)  BED MOBILITY:  Not tested  TRANSFERS: Sit to stand: SBA  Assistive device utilized: None     Stand to sit: SBA  Assistive device utilized: None      RAMP:  Not tested  CURB:  Not tested  STAIRS: Not tested GAIT: Gait pattern: Lateral gait deviations, step through pattern, decreased stride length, ataxic, lateral hip instability, decreased trunk rotation, and narrow BOS Distance walked: Various clinic distances  Assistive device utilized: None Level of assistance: Modified independence Comments: Pt demonstrates inconsistent gait pattern w/lateral deviations to both sides, but no overt LOB noted   FUNCTIONAL TESTS:  {Functional tests:24029}  PATIENT SURVEYS:  {rehab surveys:24030}   VITALS  There were no vitals filed for this visit.                                                                                                                                TREATMENT:   Assessed HR and SpO2 at rest (see above) and pt's HR at 130 bpm despite being seated for >20 minutes. Pt denied lightheadedness, dizziness or tightness in chest. Educated pt on obtaining pulse ox for home use and pt in agreement to pause PT evaluation until he sees cardiology and is cleared to participate. Rescheduled PT eval and pt instructed to call and cancel PT eval if not cleared by cardiology.  Informed pt that he may be better suited for cardiac rehab or pulmonary rehab prior to neuro rehab. Pt reports he will ask his cardiologist about these.    PATIENT EDUCATION: Education details: See above  Person educated: Patient Education method: Explanation Education comprehension: verbalized  understanding   HOME EXERCISE PROGRAM: ***  GOALS: Goals reviewed with patient? {yes/no:20286}  SHORT TERM GOALS: Target date: ***  *** Baseline: Goal status: INITIAL  2.  *** Baseline:  Goal status: INITIAL  3.  *** Baseline:  Goal status: INITIAL  4.  *** Baseline:  Goal status: INITIAL  5.  *** Baseline:  Goal status: INITIAL  6.  *** Baseline:  Goal status: INITIAL  LONG TERM GOALS: Target date: ***  *** Baseline:  Goal status: INITIAL  2.  *** Baseline:  Goal status: INITIAL  3.  *** Baseline:  Goal status: INITIAL  4.  *** Baseline:  Goal status: INITIAL  5.  *** Baseline:  Goal status: INITIAL  6.  *** Baseline:  Goal status: INITIAL  ASSESSMENT:  CLINICAL IMPRESSION: Patient is a 58 year old male referred to Neuro OPPT for physical deconditioning. Pt's PMH is significant for: depression, hypertension, hyperlipidemia and alcohol abuse. The following deficits were present during the exam: ***. Based on ***, pt is an incr risk for falls. Pt would benefit from skilled PT to address these impairments and functional limitations to maximize functional mobility independence   OBJECTIVE IMPAIRMENTS: {opptimpairments:25111}.   ACTIVITY LIMITATIONS: {activitylimitations:27494}  PARTICIPATION  LIMITATIONS: {participationrestrictions:25113}  PERSONAL FACTORS: {Personal factors:25162} are also affecting patient's functional outcome.   REHAB POTENTIAL: {rehabpotential:25112}  CLINICAL DECISION MAKING: {clinical decision making:25114}  EVALUATION COMPLEXITY: {Evaluation complexity:25115}  PLAN:  PT FREQUENCY: {rehab frequency:25116}  PT DURATION: {rehab duration:25117}  PLANNED INTERVENTIONS: {rehab planned interventions:25118::97110-Therapeutic exercises,97530- Therapeutic [phone]- Neuromuscular re-education,97535- Self Rjmz,02859- Manual therapy,Patient/Family education}  PLAN FOR NEXT SESSION: ***   [place] E  Nolin Grell, PT, DPT 09/28/2024, 8:45 AM

## 2024-10-14 ENCOUNTER — Ambulatory Visit
Admission: RE | Admit: 2024-10-14 | Discharge: 2024-10-14 | Disposition: A | Source: Ambulatory Visit | Attending: Neurology

## 2024-10-14 DIAGNOSIS — M47813 Spondylosis without myelopathy or radiculopathy, cervicothoracic region: Secondary | ICD-10-CM | POA: Diagnosis not present

## 2024-10-14 DIAGNOSIS — M5127 Other intervertebral disc displacement, lumbosacral region: Secondary | ICD-10-CM | POA: Diagnosis not present

## 2024-10-14 DIAGNOSIS — R292 Abnormal reflex: Secondary | ICD-10-CM

## 2024-10-14 DIAGNOSIS — M5021 Other cervical disc displacement,  high cervical region: Secondary | ICD-10-CM | POA: Diagnosis not present

## 2024-10-14 DIAGNOSIS — R2681 Unsteadiness on feet: Secondary | ICD-10-CM

## 2024-10-14 DIAGNOSIS — M50222 Other cervical disc displacement at C5-C6 level: Secondary | ICD-10-CM | POA: Diagnosis not present

## 2024-10-14 DIAGNOSIS — M50223 Other cervical disc displacement at C6-C7 level: Secondary | ICD-10-CM | POA: Diagnosis not present

## 2024-10-19 ENCOUNTER — Ambulatory Visit: Payer: Self-pay | Admitting: Neurology

## 2024-10-19 ENCOUNTER — Encounter: Payer: Self-pay | Admitting: Internal Medicine

## 2024-10-20 ENCOUNTER — Other Ambulatory Visit

## 2024-10-20 ENCOUNTER — Other Ambulatory Visit: Payer: Self-pay

## 2024-10-20 DIAGNOSIS — Z125 Encounter for screening for malignant neoplasm of prostate: Secondary | ICD-10-CM | POA: Diagnosis not present

## 2024-10-20 DIAGNOSIS — E60 Dietary zinc deficiency: Secondary | ICD-10-CM | POA: Diagnosis not present

## 2024-10-20 DIAGNOSIS — E785 Hyperlipidemia, unspecified: Secondary | ICD-10-CM | POA: Diagnosis not present

## 2024-10-20 DIAGNOSIS — E519 Thiamine deficiency, unspecified: Secondary | ICD-10-CM | POA: Diagnosis not present

## 2024-10-20 DIAGNOSIS — R0609 Other forms of dyspnea: Secondary | ICD-10-CM | POA: Diagnosis not present

## 2024-10-20 DIAGNOSIS — E876 Hypokalemia: Secondary | ICD-10-CM | POA: Diagnosis not present

## 2024-10-20 DIAGNOSIS — R2681 Unsteadiness on feet: Secondary | ICD-10-CM | POA: Diagnosis not present

## 2024-10-20 LAB — MAGNESIUM: Magnesium: 1.6 mg/dL (ref 1.5–2.5)

## 2024-10-20 LAB — BASIC METABOLIC PANEL WITH GFR
BUN: 3 mg/dL — ABNORMAL LOW (ref 6–23)
CO2: 31 meq/L (ref 19–32)
Calcium: 8.6 mg/dL (ref 8.4–10.5)
Chloride: 100 meq/L (ref 96–112)
Creatinine, Ser: 0.81 mg/dL (ref 0.40–1.50)
GFR: 97.44 mL/min (ref >60.00)
Glucose, Bld: 103 mg/dL — ABNORMAL HIGH (ref 70–99)
Potassium: 3.4 meq/L — ABNORMAL LOW (ref 3.5–5.1)
Sodium: 143 meq/L (ref 135–145)

## 2024-10-20 LAB — TROPONIN I (HIGH SENSITIVITY): High Sens Troponin I: 6 ng/L (ref 2–17)

## 2024-10-20 LAB — CBC WITH DIFFERENTIAL/PLATELET
Basophils Absolute: 0.1 K/uL (ref 0.0–0.1)
Basophils Relative: 0.7 % (ref 0.0–3.0)
Eosinophils Absolute: 0.4 K/uL (ref 0.0–0.7)
Eosinophils Relative: 5.3 % — ABNORMAL HIGH (ref 0.0–5.0)
HCT: 35.4 % — ABNORMAL LOW (ref 39.0–52.0)
Hemoglobin: 12 g/dL — ABNORMAL LOW (ref 13.0–17.0)
Lymphocytes Relative: 29.5 % (ref 12.0–46.0)
Lymphs Abs: 2.4 K/uL (ref 0.7–4.0)
MCHC: 33.9 g/dL (ref 30.0–36.0)
MCV: 104.8 fl — ABNORMAL HIGH (ref 78.0–100.0)
Monocytes Absolute: 0.6 K/uL (ref 0.1–1.0)
Monocytes Relative: 7.5 % (ref 3.0–12.0)
Neutro Abs: 4.6 K/uL (ref 1.4–7.7)
Neutrophils Relative %: 57 % (ref 43.0–77.0)
Platelets: 479 K/uL — ABNORMAL HIGH (ref 150.0–400.0)
RBC: 3.38 Mil/uL — ABNORMAL LOW (ref 4.22–5.81)
RDW: 13.9 % (ref 11.5–15.5)
WBC: 8.1 K/uL (ref 4.0–10.5)

## 2024-10-20 LAB — HEPATIC FUNCTION PANEL
ALT: 14 U/L (ref 0–53)
AST: 26 U/L (ref 0–37)
Albumin: 3.5 g/dL (ref 3.5–5.2)
Alkaline Phosphatase: 90 U/L (ref 39–117)
Bilirubin, Direct: 0.2 mg/dL (ref 0.0–0.3)
Total Bilirubin: 0.5 mg/dL (ref 0.2–1.2)
Total Protein: 6.4 g/dL (ref 6.0–8.3)

## 2024-10-20 LAB — PSA: PSA: 0.48 ng/mL (ref 0.10–4.00)

## 2024-10-21 ENCOUNTER — Ambulatory Visit: Payer: Self-pay | Admitting: Internal Medicine

## 2024-10-21 NOTE — Telephone Encounter (Signed)
 Patient left an inappropriate and disrespectful message after receiving communication from the school on Tuesday, December 9th.

## 2024-10-23 ENCOUNTER — Ambulatory Visit (HOSPITAL_COMMUNITY): Admission: RE | Admit: 2024-10-23 | Discharge: 2024-10-23 | Attending: Internal Medicine

## 2024-10-23 ENCOUNTER — Ambulatory Visit: Payer: Self-pay | Admitting: Internal Medicine

## 2024-10-23 DIAGNOSIS — R0609 Other forms of dyspnea: Secondary | ICD-10-CM | POA: Diagnosis not present

## 2024-10-23 DIAGNOSIS — E785 Hyperlipidemia, unspecified: Secondary | ICD-10-CM

## 2024-10-23 DIAGNOSIS — I3139 Other pericardial effusion (noninflammatory): Secondary | ICD-10-CM

## 2024-10-23 LAB — ECHOCARDIOGRAM COMPLETE
AR max vel: 3.27 cm2
AV Area VTI: 3.05 cm2
AV Area mean vel: 3.33 cm2
AV Mean grad: 2 mmHg
AV Peak grad: 2.9 mmHg
Ao pk vel: 0.86 m/s
Area-P 1/2: 4.39 cm2
S' Lateral: 2.93 cm

## 2024-10-24 LAB — B12 AND FOLATE PANEL
Folate: 6.1 ng/mL
Vitamin B-12: 849 pg/mL (ref 200–1100)

## 2024-10-24 LAB — VITAMIN B1: Vitamin B1 (Thiamine): 6 nmol/L — ABNORMAL LOW (ref 8–30)

## 2024-10-26 MED ORDER — ATORVASTATIN CALCIUM 40 MG PO TABS: 40.0000 mg | ORAL_TABLET | Freq: Every day | ORAL | 1 refills | Status: AC

## 2024-10-26 NOTE — Telephone Encounter (Signed)
 I called the patient to discuss the results of his echocardiogram and CT scan and lab results and answered his questions.   He will increase the atorvastatin  to 40 mg daily as there is nonobstructive soft plaque on the CCTA. He will continue on ASA.   His Thiamine  was low and is taking a supplement and has quit alcohol since Thanksgiving.   Thank you, Emeline Calender, DO

## 2024-10-27 ENCOUNTER — Other Ambulatory Visit: Payer: Self-pay | Admitting: Internal Medicine

## 2024-10-27 ENCOUNTER — Other Ambulatory Visit: Payer: Self-pay

## 2024-10-27 DIAGNOSIS — R5381 Other malaise: Secondary | ICD-10-CM

## 2024-10-27 DIAGNOSIS — M5030 Other cervical disc degeneration, unspecified cervical region: Secondary | ICD-10-CM

## 2024-11-10 ENCOUNTER — Encounter: Payer: Self-pay | Admitting: Acute Care

## 2024-11-10 ENCOUNTER — Telehealth: Payer: Self-pay

## 2024-11-10 NOTE — Telephone Encounter (Signed)
 Copied from CRM #[id number]. Topic: Clinical - Request for Lab/Test Order >> Nov 10, 2024  9:27 AM [name] wrote: Reason for CRM:  [name] from [facility] states CT is scheduled for 1/5 - calling to confirm if prior authorization has been completed, needs response by friday 11/13/24 at 10 am.   Callback number: [phone]    Routing to [name] to check on this.

## 2024-11-16 ENCOUNTER — Inpatient Hospital Stay
Admission: RE | Admit: 2024-11-16 | Discharge: 2024-11-16 | Disposition: A | Source: Ambulatory Visit | Attending: Acute Care | Admitting: Acute Care

## 2024-11-16 DIAGNOSIS — R911 Solitary pulmonary nodule: Secondary | ICD-10-CM

## 2024-11-17 ENCOUNTER — Ambulatory Visit: Attending: Internal Medicine | Admitting: Physical Therapy

## 2024-11-17 VITALS — HR 104

## 2024-11-17 DIAGNOSIS — R5381 Other malaise: Secondary | ICD-10-CM | POA: Diagnosis not present

## 2024-11-17 DIAGNOSIS — R2689 Other abnormalities of gait and mobility: Secondary | ICD-10-CM | POA: Diagnosis present

## 2024-11-17 DIAGNOSIS — R2681 Unsteadiness on feet: Secondary | ICD-10-CM | POA: Diagnosis present

## 2024-11-17 DIAGNOSIS — M6281 Muscle weakness (generalized): Secondary | ICD-10-CM | POA: Diagnosis present

## 2024-11-17 DIAGNOSIS — Z9181 History of falling: Secondary | ICD-10-CM | POA: Diagnosis present

## 2024-11-17 DIAGNOSIS — M542 Cervicalgia: Secondary | ICD-10-CM | POA: Insufficient documentation

## 2024-11-17 NOTE — Therapy (Signed)
 " OUTPATIENT PHYSICAL THERAPY NEURO EVALUATION   Patient Name: Collin Horne MRN: [id number] DOB:06-14-1966, 59 y.o., male Today's Date: 11/17/2024   PCP: Joshua Debby CROME, MD REFERRING PROVIDER: Joshua Debby CROME, MD  END OF SESSION:  PT End of Session - 11/17/24 1108     Visit Number 1    Number of Visits 13   Plus eval   Date for Recertification  01/26/25    Authorization Type [employer]    PT Start Time 1103    PT Stop Time 1146    PT Time Calculation (min) 43 min    Activity Tolerance Patient tolerated treatment well    Behavior During Therapy [facility] for tasks assessed/performed;Restless          Past Medical History:  Diagnosis Date   Anemia    Anxiety    Blood transfusion without reported diagnosis 10/20/24   Upper GI problem   Depression    Hyperlipidemia    Hypertension    Seizures (HCC)    Sleep apnea    Substance abuse [facility])    Past Surgical History:  Procedure Laterality Date   ANKLE SURGERY  11/12/2005   BIOPSY  10/22/2023   Procedure: BIOPSY;  Surgeon: Legrand Victory CROME DOUGLAS, MD;  Location: [facility] ENDOSCOPY;  Service: Gastroenterology;;   COLONOSCOPY     ESOPHAGOGASTRODUODENOSCOPY (EGD) WITH PROPOFOL  N/A 10/22/2023   Procedure: ESOPHAGOGASTRODUODENOSCOPY (EGD) WITH PROPOFOL ;  Surgeon: Legrand Victory CROME DOUGLAS, MD;  Location: [facility] ENDOSCOPY;  Service: Gastroenterology;  Laterality: N/A;   FRACTURE SURGERY  09/02/2024   Left ankle   SIGMOIDOSCOPY     Patient Active Problem List   Diagnosis Date Noted   Physical deconditioning 08/26/2024   Coronary artery disease of native artery of native heart with stable angina pectoris 06/07/2024   Alcohol abuse 05/19/2024   Hypokalemia due to excessive gastrointestinal loss of potassium 05/19/2024   Anemia due to zinc  deficiency 04/13/2024   Chronic hyperglycemia 03/03/2024   PUD (peptic ulcer disease) 10/22/2023   Gastroesophageal reflux disease with esophagitis without hemorrhage 10/22/2023   Chronic gastric ulcer with  hemorrhage 10/22/2023   Anemia due to acquired thiamine  deficiency 04/08/2023   Dyslipidemia, goal LDL below 70 12/25/2022   Hypertension 12/24/2022   Low testosterone  in male 10/02/2022   Erectile dysfunction due to arterial insufficiency 04/12/2022   Prostate cancer screening 12/21/2021   Nuclear sclerosis of both eyes 02/05/2020   Vitamin B12 deficiency anemia due to intrinsic factor deficiency 09/03/2018   Moderate episode of recurrent major depressive disorder (HCC) 07/08/2018   Uncomplicated alcohol dependence (HCC) 07/08/2018   Pure hyperglyceridemia 07/16/2017   Facial dermatitis 07/16/2017   Seborrheic dermatitis 06/17/2017   DDD (degenerative disc disease), cervical 06/17/2017   Tobacco abuse 04/10/2016    ONSET DATE: 10/27/2024 (referral)   REFERRING DIAG: R53.81 (ICD-10-CM) - Physical deconditioning  THERAPY DIAG:  Muscle weakness (generalized)  Other abnormalities of gait and mobility  Unsteadiness on feet  History of falling  Rationale for Evaluation and Treatment: Rehabilitation  SUBJECTIVE:  SUBJECTIVE STATEMENT: Pt returns to clinic following appointment w/cardiologist and neurologist. Pt reports he had echocardiogram performed and was cleared to participate in PT. Does have another echo in a few weeks, does not know why. Saw neurology and had a CT of his cervical and lumbar spine. Legs are weak, had to use WC in [place] airport due to fatigue and SOB when traveling for holidays as he could not walk from the terminal. States he walked around a botanical garden in [place]  but then experienced blurry vision while driving, I felt impaired/ This occurred about 1.5 weeks ago.   Has had one fall since last eval due to knees buckling on him. States if he feels weak in his legs, he knows  to sit down prior to them giving out.   States he started taking vitamin B1 supplements.   Is having neck pain and has noticed weakness of his BUEs as well as numbness of his R index finger. Wants to work on his neck in PT.    From previous evaluation on 09/07/24: Pt presents alone, no AD. States he gets winded after very minor exertions. Has not been able to work for about a year due to an ankle fracture that he sustained last year and then he became sedentary due to DOE. Has seen neurologist and PCP with no answers. Sees the cardiologist next week to rule out cardiac involvement. States his recent lung CT did show a mass on his lungs but was told not to worry about it. Is not scheduled to see a pulmonologist.    Has not had any falls but occasionally feels as though his legs are going to give out on him and he needs to sit down. Has a hard time standing back up when this happens. Occasionally gets so fatigued that he will vomit, cannot remember if he told his MD this.    Has orthostatic hypotension and has to be very careful to not move too quickly.   Pt accompanied by: self  PERTINENT HISTORY: depression, hypertension, hyperlipidemia, and alcohol abuse  PAIN:  Are you having pain? Yes: NPRS scale: 0/10 at rest, 3/10 w/rotation bilaterally Pain location: neck Pain description: achy   PRECAUTIONS: Fall  RED FLAGS: None   WEIGHT BEARING RESTRICTIONS: No  FALLS: Has patient fallen in last 6 months? Yes. Number of falls 1  LIVING ENVIRONMENT: Lives with: lives alone Lives in: House/apartment Stairs: Yes: External: 3 in back and 5-6 in front steps; on right going up, on left going up, and can reach both Has following equipment at home: Single point cane and Walker - 2 wheeled  PLOF: Independent  PATIENT GOALS: I definitely want to increase my stamina, increase what feels like having no muscles in my legs, I could probably use some work on my core muscles   OBJECTIVE:  Note:  Objective measures were completed at Evaluation unless otherwise noted.  DIAGNOSTIC FINDINGS: CT of chest from 08/12/24 FINDINGS: Cardiovascular: Aortic atherosclerosis and coronary artery calcification. Heart size normal. No pericardial effusion.   Mediastinum/Nodes: No enlarged mediastinal, hilar, or axillary lymph nodes. Thyroid  gland, trachea, and esophagus demonstrate no significant findings.   Lungs/Pleura: Emphysema with diffuse bronchial wall thickening. Part solid nodule within the medial right apex has progressed from the previous exam, image 48/3. On today's study this now appears part solid measuring 13.7 mm with central solid component measuring 1.2 mm. The previous exam this was entirely ground-glass in appearance measuring 10.8 mm. Calcified granulomas again noted in the  left upper lobe.   Upper Abdomen: No acute abnormality. Hepatic steatosis. No acute or suspicious osseous findings.   Musculoskeletal: Multiple remote right posterior rib fractures. Stable mild compression deformities involving T10 and T11.  CT of chest from 11/16/24 not read at time of eval   COGNITION: Overall cognitive status: Within functional limits for tasks assessed   SENSATION: Pt denied numbness/tingling in BLEs, R index finger is numb   COORDINATION: Pt reports impaired dexterity and fine motor skills in BUEs   POSTURE: rounded shoulders  LOWER EXTREMITY ROM:     Active  Right Eval Left Eval  Hip flexion    Hip extension    Hip abduction    Hip adduction    Hip internal rotation    Hip external rotation    Knee flexion    Knee extension    Ankle dorsiflexion    Ankle plantarflexion    Ankle inversion    Ankle eversion     (Blank rows = not tested)  LOWER EXTREMITY MMT:    MMT Right Eval Left Eval  Hip flexion    Hip extension    Hip abduction    Hip adduction    Hip internal rotation    Hip external rotation    Knee flexion    Knee extension    Ankle  dorsiflexion    Ankle plantarflexion    Ankle inversion    Ankle eversion    (Blank rows = not tested)  BED MOBILITY:  Not tested  TRANSFERS: Sit to stand: SBA  Assistive device utilized: None     Stand to sit: SBA  Assistive device utilized: None     Wide BOS, lack of full knee/hip extension   RAMP:  Not tested  CURB:  Not tested  STAIRS: Not tested GAIT: Gait pattern: step through pattern, decreased stride length, ataxic, lateral hip instability, and wide BOS Distance walked: Various clinic distances  Assistive device utilized: None Level of assistance: Modified independence Comments: Pt w/lateral deviations but no overt LOB noted    FUNCTIONAL TESTS:   [facility] PT Assessment - 11/17/24 1139       Balance   Balance Assessed Yes      Standardized Balance Assessment   Standardized Balance Assessment Five Times Sit to Stand    Five times sit to stand comments  12.47s   wide BOS, cautious, lack of full hip/knee extension          VITALS  Vitals:   11/17/24 1115  Pulse: (!) 104  SpO2: 98%                                                                                                                                 TREATMENT:   Self-care/home management  Assessed HR and SpO2 (see above) and HR continues to be tachycardic but improved from previous evaluation.  Discussed differences between neuro rehab and cardiac rehab. Pt preferring neuro  rehab at this time but will continue to monitor.  Educated pt on impact Thiamin has on balance and how this could be contributing to his instability. Will continue to monitor in PT  Discussed PT POC and importance of preserving PT visits to use throughout the year prn  Educated pt on importance of working on HEP and being consistent w/exercise for improved strength, reduced fall risk and improvement in PT. Pt reports he is not good at homework but will work on this.    PATIENT EDUCATION: Education details: PT POC, eval  findings, see self-care above  Person educated: Patient Education method: Explanation Education comprehension: verbalized understanding and needs further education  HOME EXERCISE PROGRAM: To be established   GOALS: Goals reviewed with patient? Yes  SHORT TERM GOALS: Target date: 12/15/2024   Pt will be consistent with initial HEP and walking program for improved strength, balance, transfers and gait. Baseline: not established on eval  Goal status: INITIAL  2.  to be assessed and LTG updated  Baseline:  Goal status: INITIAL  3.  MCTSIB to be assessed and STG/LTG updated  Baseline:  Goal status: INITIAL  4.  to be assessed and LTG updated  Baseline:  Goal status: INITIAL  5.  FGA to be assessed and LTG updated  Baseline:  Goal status: INITIAL  6. NDI to be assessed and LTG updated   Baseline:   Goal status: INITIAL   LONG TERM GOALS: Target date: 01/12/2025   Pt will be consistent with final HEP and walking program for improved strength, balance, transfers and gait. Baseline:  Goal status: INITIAL  2.  goal  Baseline:  Goal status: INITIAL  3.  goal Baseline:  Goal status: INITIAL  4.  FGA goal  Baseline:  Goal status: INITIAL  5.  MCTSIB goal Baseline:  Goal status: INITIAL  6.  Pt will improve 5 x STS to less than or equal to 11 seconds w/proper body mechanics to demonstrate improved functional strength and transfer efficiency.   Baseline: 12.47s w/wide BOS and lack of full knee/hip extension (1/6) Goal status: INITIAL  7. NDI goal   Baseline:   Goal Status: INITIAL   ASSESSMENT:  CLINICAL IMPRESSION: Evaluation limited as majority of time spent obtaining subjective report and providing pt education. Patient is a 59 year old male referred to Neuro OPPT for physical deconditioning. Pt's PMH is significant for: depression, hypertension, hyperlipidemia, and alcohol abuse. The following deficits were present during the exam:  decreased functional strength, cervicalgia, decreased activity tolerance, impaired balance and restlessness. Based on recent falls, 5x STS and BLE weakness, pt is an incr risk for falls. Pt would benefit from skilled PT to address these impairments and functional limitations to maximize functional mobility independence.    OBJECTIVE IMPAIRMENTS: Abnormal gait, cardiopulmonary status limiting activity, decreased activity tolerance, decreased balance, decreased coordination, decreased endurance, decreased knowledge of condition, difficulty walking, decreased strength, impaired sensation, impaired UE functional use, impaired vision/preception, improper body mechanics, and pain   ACTIVITY LIMITATIONS: carrying, lifting, bending, standing, transfers, and locomotion level  PARTICIPATION LIMITATIONS: driving, shopping, community activity, occupation, and yard work  PERSONAL FACTORS: Fitness, Past/current experiences, and 1-2 comorbidities: DOE and cervicalgia are also affecting patient's functional outcome.   REHAB POTENTIAL: Good  CLINICAL DECISION MAKING: Evolving/moderate complexity  EVALUATION COMPLEXITY: Moderate  PLAN:  PT FREQUENCY: 2x/week for 4 weeks and then 1x/week for 4 weeks   PT DURATION: 8 weeks  PLANNED INTERVENTIONS: 02835- PT Re-evaluation, 97750-  Physical Performance Testing, 97110-Therapeutic exercises, 97530- Therapeutic activity, [id number]- Neuromuscular re-education, [phone]- Self Care, 02859- Manual therapy, [phone]- Gait training, [phone]- Aquatic Therapy, Patient/Family education, Balance training, Stair training, Joint mobilization, Spinal mobilization, Vestibular training, and DME instructions  PLAN FOR NEXT SESSION: Monitor vitals. , , MCTSIB, NDI and FGA and update goals. Establish walking program and HEP for BLE strength/coordination    Collin Horne, PT, DPT 11/17/2024, 3:23 PM        "

## 2024-11-23 ENCOUNTER — Telehealth: Payer: Self-pay | Admitting: Acute Care

## 2024-11-23 ENCOUNTER — Other Ambulatory Visit: Payer: Self-pay | Admitting: Acute Care

## 2024-11-23 ENCOUNTER — Telehealth: Payer: Self-pay | Admitting: *Deleted

## 2024-11-23 DIAGNOSIS — R911 Solitary pulmonary nodule: Secondary | ICD-10-CM

## 2024-11-23 NOTE — Telephone Encounter (Signed)
 Call report from [facility]:  IMPRESSION: 1. Lung-RADS 3, probably benign findings. Short-term follow-up in 6 months is recommended with repeat low-dose chest CT without contrast (please use the following order, CT CHEST LCS NODULE FOLLOW-UP W/O CM). Part solid 14.4 mm medial apical right upper lobe nodule with 1.5 mm solid component, not substantially changed since 08/12/2024 CT. Low grade indolent primary bronchogenic adenocarcinoma not excluded. 2. Two-vessel coronary atherosclerosis. 3. Aortic Atherosclerosis (ICD10-I70.0) and Emphysema (ICD10-J43.9).

## 2024-11-23 NOTE — Telephone Encounter (Signed)
 I have called the patient with the results of his low dose CT Chest. His scan was read as a LR 3. Usually a 6 month follow up scan.  There is a right upper lobe nodule currently 14.4 mm with a 1.5 mm solid component.  Looking back over his imaging, this nodule has had slow interval growth since 2023. It has gone from 10.5 mm>> 10.8 mm>> 13.7 mm with a 1.2 mm solid component, now 14.4 mm with a 1.5 mm component. This last  interval increase has been in 3 months.  As this has shown steady growth and an increasing solid component at a short term interval, we will move forward with a PET scan now.  Pt. Verbalized understanding and is in agreement with this plan. PET scan has been ordered and he will need follow up with me 1-2 weeks after scan to review results and determine next best steps moving forward. He has our contact information in the event he has further questions.  Please fax results to Dr. Joshua. He is added to this message as an [employer]CIAL [employer]. Thanks so much

## 2024-11-23 NOTE — Telephone Encounter (Signed)
 Result and plan to PCP.

## 2024-11-23 NOTE — Progress Notes (Signed)
 See telephone note dated 11/23/2024.

## 2024-11-25 NOTE — Telephone Encounter (Signed)
 See provider note 11/23/2024 for update.

## 2024-11-26 ENCOUNTER — Ambulatory Visit: Admitting: Physical Therapy

## 2024-11-27 ENCOUNTER — Ambulatory Visit: Admitting: Physical Therapy

## 2024-11-27 VITALS — BP 124/80 | HR 122

## 2024-11-27 DIAGNOSIS — R2689 Other abnormalities of gait and mobility: Secondary | ICD-10-CM

## 2024-11-27 DIAGNOSIS — M542 Cervicalgia: Secondary | ICD-10-CM

## 2024-11-27 DIAGNOSIS — M6281 Muscle weakness (generalized): Secondary | ICD-10-CM

## 2024-11-27 DIAGNOSIS — R2681 Unsteadiness on feet: Secondary | ICD-10-CM

## 2024-11-27 NOTE — Therapy (Signed)
 " OUTPATIENT PHYSICAL THERAPY NEURO TREATMENT   Patient Name: Collin Horne MRN: [id number] DOB:1966/05/01, 59 y.o., male Today's Date: 11/27/2024   PCP: Joshua Debby CROME, MD REFERRING PROVIDER: Joshua Debby CROME, MD  END OF SESSION:  PT End of Session - 11/27/24 1447     Visit Number 2    Number of Visits 13   Plus eval   Date for Recertification  01/26/25    Authorization Type [employer]    PT Start Time 1446    PT Stop Time 1533    PT Time Calculation (min) 47 min    Activity Tolerance Patient tolerated treatment well;Other (comment)   Tachycardia   Behavior During Therapy [facility] for tasks assessed/performed;Restless          Past Medical History:  Diagnosis Date   Anemia    Anxiety    Blood transfusion without reported diagnosis 10/20/24   Upper GI problem   Depression    Hyperlipidemia    Hypertension    Seizures (HCC)    Sleep apnea    Substance abuse [facility])    Past Surgical History:  Procedure Laterality Date   ANKLE SURGERY  11/12/2005   BIOPSY  10/22/2023   Procedure: BIOPSY;  Surgeon: Legrand Victory CROME DOUGLAS, MD;  Location: [facility];  Service: Gastroenterology;;   COLONOSCOPY     ESOPHAGOGASTRODUODENOSCOPY (EGD) WITH PROPOFOL  N/A 10/22/2023   Procedure: ESOPHAGOGASTRODUODENOSCOPY (EGD) WITH PROPOFOL ;  Surgeon: Legrand Victory CROME DOUGLAS, MD;  Location: [facility]OSCOPY;  Service: Gastroenterology;  Laterality: N/A;   FRACTURE SURGERY  09/02/2024   Left ankle   SIGMOIDOSCOPY     Patient Active Problem List   Diagnosis Date Noted   Physical deconditioning 08/26/2024   Coronary artery disease of native artery of native heart with stable angina pectoris 06/07/2024   Alcohol abuse 05/19/2024   Hypokalemia due to excessive gastrointestinal loss of potassium 05/19/2024   Anemia due to zinc  deficiency 04/13/2024   Chronic hyperglycemia 03/03/2024   PUD (peptic ulcer disease) 10/22/2023   Gastroesophageal reflux disease with esophagitis without hemorrhage 10/22/2023    Chronic gastric ulcer with hemorrhage 10/22/2023   Anemia due to acquired thiamine  deficiency 04/08/2023   Dyslipidemia, goal LDL below 70 12/25/2022   Hypertension 12/24/2022   Low testosterone  in male 10/02/2022   Erectile dysfunction due to arterial insufficiency 04/12/2022   Prostate cancer screening 12/21/2021   Nuclear sclerosis of both eyes 02/05/2020   Vitamin B12 deficiency anemia due to intrinsic factor deficiency 09/03/2018   Moderate episode of recurrent major depressive disorder (HCC) 07/08/2018   Uncomplicated alcohol dependence (HCC) 07/08/2018   Pure hyperglyceridemia 07/16/2017   Facial dermatitis 07/16/2017   Seborrheic dermatitis 06/17/2017   DDD (degenerative disc disease), cervical 06/17/2017   Tobacco abuse 04/10/2016    ONSET DATE: 10/27/2024 (referral)   REFERRING DIAG: R53.81 (ICD-10-CM) - Physical deconditioning  THERAPY DIAG:  Muscle weakness (generalized)  Other abnormalities of gait and mobility  Unsteadiness on feet  Cervicalgia  Rationale for Evaluation and Treatment: Rehabilitation  SUBJECTIVE:  SUBJECTIVE STATEMENT: Pt presents without AD. Denies falls or acute changes. Just woke up from a nap.   Pt accompanied by: self  PERTINENT HISTORY: depression, hypertension, hyperlipidemia, and alcohol abuse  PAIN:  Are you having pain? Yes: NPRS scale: 0/10 at rest, 3/10 w/rotation bilaterally Pain location: neck Pain description: achy   PRECAUTIONS: Fall  RED FLAGS: None   WEIGHT BEARING RESTRICTIONS: No  FALLS: Has patient fallen in last 6 months? Yes. Number of falls 1  LIVING ENVIRONMENT: Lives with: lives alone Lives in: House/apartment Stairs: Yes: External: 3 in back and 5-6 in front steps; on right going up, on left going up, and can reach  both Has following equipment at home: Single point cane and Walker - 2 wheeled  PLOF: Independent  PATIENT GOALS: I definitely want to increase my stamina, increase what feels like having no muscles in my legs, I could probably use some work on my core muscles   OBJECTIVE:  Note: Objective measures were completed at Evaluation unless otherwise noted.  DIAGNOSTIC FINDINGS: CT of chest from 08/12/24 FINDINGS: Cardiovascular: Aortic atherosclerosis and coronary artery calcification. Heart size normal. No pericardial effusion.   Mediastinum/Nodes: No enlarged mediastinal, hilar, or axillary lymph nodes. Thyroid  gland, trachea, and esophagus demonstrate no significant findings.   Lungs/Pleura: Emphysema with diffuse bronchial wall thickening. Part solid nodule within the medial right apex has progressed from the previous exam, image 48/3. On today's study this now appears part solid measuring 13.7 mm with central solid component measuring 1.2 mm. The previous exam this was entirely ground-glass in appearance measuring 10.8 mm. Calcified granulomas again noted in the left upper lobe.   Upper Abdomen: No acute abnormality. Hepatic steatosis. No acute or suspicious osseous findings.   Musculoskeletal: Multiple remote right posterior rib fractures. Stable mild compression deformities involving T10 and T11.  CT of chest from 11/16/24 not read at time of eval   COGNITION: Overall cognitive status: Within functional limits for tasks assessed   SENSATION: Pt denied numbness/tingling in BLEs, R index finger is numb   COORDINATION: Pt reports impaired dexterity and fine motor skills in BUEs   POSTURE: rounded shoulders  LOWER EXTREMITY ROM:     Active  Right Eval Left Eval  Hip flexion    Hip extension    Hip abduction    Hip adduction    Hip internal rotation    Hip external rotation    Knee flexion    Knee extension    Ankle dorsiflexion    Ankle plantarflexion     Ankle inversion    Ankle eversion     (Blank rows = not tested)  LOWER EXTREMITY MMT:    MMT Right Eval Left Eval  Hip flexion    Hip extension    Hip abduction    Hip adduction    Hip internal rotation    Hip external rotation    Knee flexion    Knee extension    Ankle dorsiflexion    Ankle plantarflexion    Ankle inversion    Ankle eversion    (Blank rows = not tested)  BED MOBILITY:  Not tested  TRANSFERS: Sit to stand: SBA  Assistive device utilized: None     Stand to sit: SBA  Assistive device utilized: None     Wide BOS, lack of full knee/hip extension   RAMP:  Not tested  CURB:  Not tested  STAIRS: Not tested GAIT: Gait pattern: step through pattern, decreased stride length, ataxic, lateral  hip instability, and wide BOS Distance walked: Various clinic distances  Assistive device utilized: None Level of assistance: Modified independence Comments: Pt w/lateral deviations but no overt LOB noted    FUNCTIONAL TESTS:      VITALS  Vitals:   11/27/24 1451  BP: 124/80  Pulse: (!) 122  SpO2: 98%                                                                                                                                  TREATMENT:   Self-care/home management/ther act  Assessed vitals (see above) and BP WNL but HR tachycardic. Continued to monitor throughout session.  SciFit multi-peaks level 2.5 for 8 minutes using BUE/BLEs for neural priming for reciprocal movement, dynamic cardiovascular warmup and increased amplitude of stepping. HR 125 bpm and SpO2 at 95% following activity.  Established HEP for improved BLE strength and endurance:  Staggered squats w/8# DB in goblet position, x10 reps per isde. Incr difficulty on R side w/single LOB in anterior direction, requiring CGA. Recommend pt place sturdy chair or wall in front of him when performing at home for safety,  Lateral lunges w/BUE support on counter, x10 reps per side. Pt performing quickly  due to exercise reminding him of skiing drills, so cued pt to slow down. HR at 152 bpm following activity, so provided lengthy rest break and HR did reduce to 125 bpm after 2 minutes.  Discussed importance of monitoring HR w/exercise and will limit max HR to 140 bpm in PT. Pt reports he has had tachycardia for a while and stopped taking his beta blocker, so recommended pt speak to cardiologist about this.    PATIENT EDUCATION: Education details: Initial HEP, max HR and monitoring HR w/activity  Person educated: Patient Education method: Programmer, Multimedia, Demonstration, Verbal cues, and Handouts Education comprehension: verbalized understanding, returned demonstration, verbal cues required, and needs further education  HOME EXERCISE PROGRAM: Access Code: [email or web address] URL: [email or web address] Date: 11/27/2024 Prepared by: Marlon Lake Cinquemani  Exercises - Staggered Stance Squat  - 1 x daily - 7 x weekly - 1-2 sets - 10 reps - Side Lunge with Counter Support  - 1 x daily - 7 x weekly - 3 sets - 10 reps  GOALS: Goals reviewed with patient? Yes  SHORT TERM GOALS: Target date: 12/15/2024   Pt will be consistent with initial HEP and walking program for improved strength, balance, transfers and gait. Baseline: not established on eval  Goal status: INITIAL  2.  to be assessed and LTG updated  Baseline:  Goal status: INITIAL  3.  MCTSIB to be assessed and STG/LTG updated  Baseline:  Goal status: INITIAL  4.  to be assessed and LTG updated  Baseline:  Goal status: INITIAL  5.  FGA to be assessed and LTG updated  Baseline:  Goal status: INITIAL  6. NDI to be assessed and LTG updated   Baseline:   Goal status: INITIAL  LONG TERM GOALS: Target date: 01/12/2025   Pt will be consistent with final HEP and walking program for improved strength, balance, transfers and gait. Baseline:  Goal status: INITIAL  2.  goal  Baseline:  Goal status: INITIAL  3.   goal Baseline:  Goal status: INITIAL  4.  FGA goal  Baseline:  Goal status: INITIAL  5.  MCTSIB goal Baseline:  Goal status: INITIAL  6.  Pt will improve 5 x STS to less than or equal to 11 seconds w/proper body mechanics to demonstrate improved functional strength and transfer efficiency.   Baseline: 12.47s w/wide BOS and lack of full knee/hip extension (1/6) Goal status: INITIAL  7. NDI goal   Baseline:   Goal Status: INITIAL   ASSESSMENT:  CLINICAL IMPRESSION: Emphasis of skilled PT session on monitoring vitals, endurance and establishing initial HEP. Session limited by pt's tachycardia, averaging 125 bpm but reaching 152 bpm following lunges w/BUE support. Recommended pt speak to cardiologist about tachycardia and encouraged pt to monitor HR w/exercise and try to keep below 140 bpm. Pt demonstrates increased instability on RLE >LLE, so educated pt on how to perform exercises safely at home as pt did have single anterior LOB during session. Continue POC.    OBJECTIVE IMPAIRMENTS: Abnormal gait, cardiopulmonary status limiting activity, decreased activity tolerance, decreased balance, decreased coordination, decreased endurance, decreased knowledge of condition, difficulty walking, decreased strength, impaired sensation, impaired UE functional use, impaired vision/preception, improper body mechanics, and pain   ACTIVITY LIMITATIONS: carrying, lifting, bending, standing, transfers, and locomotion level  PARTICIPATION LIMITATIONS: driving, shopping, community activity, occupation, and yard work  PERSONAL FACTORS: Fitness, Past/current experiences, and 1-2 comorbidities: DOE and cervicalgia are also affecting patient's functional outcome.   REHAB POTENTIAL: Good  CLINICAL DECISION MAKING: Evolving/moderate complexity  EVALUATION COMPLEXITY: Moderate  PLAN:  PT FREQUENCY: 2x/week for 4 weeks and then 1x/week for 4 weeks   PT DURATION: 8 weeks  PLANNED INTERVENTIONS:  [id number]- PT Re-evaluation, 97750- Physical Performance Testing, 97110-Therapeutic exercises, 97530- Therapeutic activity, [id number]- Neuromuscular re-education, 97535- Self Care, [id number]- Manual therapy, [id number]- Gait training, [id number]- Aquatic Therapy, Patient/Family education, Balance training, Stair training, Joint mobilization, Spinal mobilization, Vestibular training, and DME instructions  PLAN FOR NEXT SESSION: Monitor vitals. , , MCTSIB, NDI and FGA and update goals. Establish walking program and HEP for BLE strength/coordination    Marlon BRAVO Elinore Shults, PT, DPT 11/27/2024, 3:33 PM        "

## 2024-11-30 ENCOUNTER — Ambulatory Visit: Admitting: Physical Therapy

## 2024-12-02 ENCOUNTER — Ambulatory Visit: Admitting: Physical Therapy

## 2024-12-04 ENCOUNTER — Ambulatory Visit (HOSPITAL_COMMUNITY)
Admission: RE | Admit: 2024-12-04 | Discharge: 2024-12-04 | Disposition: A | Source: Ambulatory Visit | Attending: Acute Care | Admitting: Acute Care

## 2024-12-04 ENCOUNTER — Ambulatory Visit (HOSPITAL_COMMUNITY)
Admission: RE | Admit: 2024-12-04 | Discharge: 2024-12-04 | Disposition: A | Source: Ambulatory Visit | Attending: Internal Medicine

## 2024-12-04 DIAGNOSIS — R911 Solitary pulmonary nodule: Secondary | ICD-10-CM

## 2024-12-04 DIAGNOSIS — I3139 Other pericardial effusion (noninflammatory): Secondary | ICD-10-CM | POA: Insufficient documentation

## 2024-12-04 LAB — GLUCOSE, CAPILLARY: Glucose-Capillary: 115 mg/dL — ABNORMAL HIGH (ref 70–99)

## 2024-12-04 MED ORDER — FLUDEOXYGLUCOSE F - 18 (FDG) INJECTION
8.4000 | Freq: Once | INTRAVENOUS | Status: AC
  Administered 2024-12-04: 8.4 via INTRAVENOUS

## 2024-12-06 LAB — ECHOCARDIOGRAM LIMITED

## 2024-12-07 ENCOUNTER — Ambulatory Visit: Admitting: Physical Therapy

## 2024-12-08 ENCOUNTER — Ambulatory Visit: Payer: Self-pay | Admitting: Internal Medicine

## 2024-12-08 ENCOUNTER — Telehealth: Payer: Self-pay

## 2024-12-08 NOTE — Telephone Encounter (Signed)
 Copied from CRM #[id number]. Topic: Clinical - Lab/Test Results >> Dec 07, 2024 11:37 AM Leila BROCKS wrote: Reason for CRM: Patient [phone] wants to NP, Groce intrepretation  on PET scan, and is concerned and wants to speak NP, Groce. Patient does not have the appointment in his Mychart. Confirmed appointment with NP, Ruthell 12/15/24 at 4 pm. Patient sees the appointment now. Patient wants to speak with NP, Groce before office visit.  Patient wants to speak with NP, Groce before office visit.,Please advise

## 2024-12-09 ENCOUNTER — Telehealth: Payer: Self-pay | Admitting: Physical Therapy

## 2024-12-09 ENCOUNTER — Ambulatory Visit: Admitting: Physical Therapy

## 2024-12-09 NOTE — Telephone Encounter (Signed)
 Called pt and left VM regarding no-show to scheduled PT appointment. Informed pt of next appointment date and time and requested he call to cancel appointment ahead of time if unable to make it. Provided pt w/clinic's number.   Algis Lehenbauer E Amarionna Arca, PT, DPT

## 2024-12-10 ENCOUNTER — Telehealth: Payer: Self-pay

## 2024-12-10 NOTE — Telephone Encounter (Signed)
 Copied from CRM #[id number]. Topic: Clinical - Lab/Test Results >> Dec 07, 2024 11:37 AM Collin Horne wrote: Reason for CRM: Patient [phone] wants to NP, Groce intrepretation  on PET scan, and is concerned and wants to speak NP, Groce. Patient does not have the appointment in his Mychart. Confirmed appointment with NP, Ruthell 12/15/24 at 4 pm. Patient sees the appointment now. Patient wants to speak with NP, Groce before office visit. >> Dec 09, 2024  3:19 PM Collin Horne wrote: Patient is calling again regarding his previous message.  Please return his call at [phone].  Thanks.  Message already sent in

## 2024-12-11 ENCOUNTER — Ambulatory Visit: Attending: Internal Medicine | Admitting: Internal Medicine

## 2024-12-11 NOTE — Progress Notes (Unsigned)
" °  Cardiology Office Note:  .   Date:  12/11/2024  ID:  Collin Horne, DOB 1966-08-29, MRN [id number] PCP: Joshua Debby CROME, MD  [facility] Providers Cardiologist:  Emeline FORBES Calender, DO { Click to update primary MD,subspecialty MD or APP then REFRESH:1}   History of Present Illness: .     Discussed the use of AI scribe software for clinical note transcription with the patient, who gave verbal consent to proceed.  History of Present Illness           ROS: Remaining review of systems negative  Studies Reviewed: .        Results  Risk Assessment/Calculations:   {Does this patient have ATRIAL FIBRILLATION?:[phone]} No BP recorded.  {Refresh Note OR Click here to enter BP  :1}***       Physical Exam:   VS:  There were no vitals taken for this visit.   Wt Readings from Last 3 Encounters:  09/23/24 170 lb (77.1 kg)  09/23/24 170 lb (77.1 kg)  08/25/24 169 lb 6.4 oz (76.8 kg)    GEN: Well nourished, well developed in no acute distress NECK: No JVD; No carotid bruits CARDIAC: ***RRR, no murmurs, no rubs, no gallops RESPIRATORY:  Clear to auscultation without rales, wheezing or rhonchi  ABDOMEN: Soft, non-tender, non-distended EXTREMITIES:  No edema; No deformity   ASSESSMENT AND PLAN: .    Assessment and Plan Assessment & Plan        {Are you ordering a CV Procedure (e.g. stress test, cath, DCCV, TEE, etc)?   Press F2        :[id number]}    Follow up: ***  Signed, Emeline FORBES Calender, DO  12/11/2024 9:50 AM    [facility] "

## 2024-12-14 ENCOUNTER — Ambulatory Visit: Admitting: Physical Therapy

## 2024-12-15 ENCOUNTER — Ambulatory Visit: Admitting: Acute Care

## 2024-12-15 ENCOUNTER — Encounter: Payer: Self-pay | Admitting: Acute Care

## 2024-12-15 VITALS — BP 102/81 | HR 118 | Temp 100.1°F | Ht 65.0 in | Wt 164.2 lb

## 2024-12-15 DIAGNOSIS — Z87891 Personal history of nicotine dependence: Secondary | ICD-10-CM

## 2024-12-15 DIAGNOSIS — R9389 Abnormal findings on diagnostic imaging of other specified body structures: Secondary | ICD-10-CM

## 2024-12-15 DIAGNOSIS — R911 Solitary pulmonary nodule: Secondary | ICD-10-CM | POA: Diagnosis not present

## 2024-12-15 DIAGNOSIS — Z7289 Other problems related to lifestyle: Secondary | ICD-10-CM

## 2024-12-15 DIAGNOSIS — R509 Fever, unspecified: Secondary | ICD-10-CM | POA: Diagnosis not present

## 2024-12-15 DIAGNOSIS — R0609 Other forms of dyspnea: Secondary | ICD-10-CM

## 2024-12-15 DIAGNOSIS — R29898 Other symptoms and signs involving the musculoskeletal system: Secondary | ICD-10-CM

## 2024-12-15 DIAGNOSIS — R942 Abnormal results of pulmonary function studies: Secondary | ICD-10-CM

## 2024-12-15 NOTE — Progress Notes (Signed)
 "  History of Present Illness Collin Horne is a 59 y.o. male former smoker followed through the lung cancer screening program. He quit in 2021, but he does still vape. He is here today to further evaluate a growing partially solid lung nodule. He will be followed by the Interventional Pulmonary Team.   12/15/2024 Discussed the use of AI scribe software for clinical note transcription with the patient, who gave verbal consent to proceed.  History of Present Illness Pt. Presents for follow up after abnormal pulmonary nodule  noted on lung cancer screening scan. The scan done 12/04/2024 was read as a LR 3, but he has a nodule that has increased in size from 10.5 mm in 02/2022,  to 10.8 mm in 03/2023 to 13.7 mm in 08/2024 with a 1.2 mm solid component to a 14.4 mm nodule with a 1.5 solid component 11/2024.This last interval of growth was over a period of 3 months.    He had a PET scan  to further evaluate the nodule. The nodule of concern was not hypermetabolic, which is reassuring, but it is still concerning for a low grade adenocarcinoma. Radiology recommended a 6 month follow up scan. Pt. Would prefer a 3 month follow up as it had increased in size over the last 3 month interval.  Plan will be for a 3 month follow up scan. This will be due 02/2025. He will follow up with me 1 week after the scan to review results.   Pt. Complains of dyspnea on exertion. Quit smoking 2021, but still vapes.Dyspnea is not new, but is worse than it has been in the past. . He has not had PFT's since 2017. He needs repeat PFT's to re-evaluate for COPD. He will follow up with me  1-2 weeks after the PFT's to review results.  Of note, patient had a fever of 101 today in the office. He is totally asymptomatic. We did a Covid and Flu test, both were negative. I have asked him to follow up with his PCP if he becomes symptomatic.  Patient is also being worked up and evaluated by neurology for lower extremity weakness.  We did discuss  that there was notation on his CT scan of chronic mild T10 and T11 vertebral compression fractures that are unchanged.  This could potentially be impacting his lower extremity weakness.  He is being followed by neurology.    Test Results: PET scan  12/04/2024 No hypermetabolic activity associated with the part solid right apical pulmonary nodule. This was categorized as Lung-RADS 3 on recent LCS CT. Low-grade adenocarcinoma could have this appearance. Short-term follow-up in 6 months is recommended with repeat low-dose chest CT without contrast. 2. No evidence of metastatic disease. 3. Mild asymmetric metabolic activity within the right palatine tonsil without corresponding suspicious finding on the CT images, likely inflammatory. Correlate with direct visualization. 4. Hepatic steatosis. 5. Aortic Atherosclerosis (ICD10-I70.0) and Emphysema (ICD10-J43.9).  LDCT 11/16/2024 Lung-RADS 3, probably benign findings. Short-term follow-up in 6 months is recommended with repeat low-dose chest CT without contrast (please use the following order, CT CHEST LCS NODULE FOLLOW-UP W/O CM). Part solid 14.4 mm medial apical right upper lobe nodule with 1.5 mm solid component, not substantially changed since 08/12/2024 CT. Low grade indolent primary bronchogenic adenocarcinoma not excluded. 2. Two-vessel coronary atherosclerosis. 3. Aortic Atherosclerosis (ICD10-I70.0) and Emphysema (ICD10-J43.9).  PFT 06/20/2016               Latest Ref Rng & Units 10/20/2024  1:24 PM 05/19/2024    2:11 PM 04/09/2024    9:44 AM  CBC  WBC 4.0 - 10.5 K/uL 8.1  5.2  7.9   Hemoglobin 13.0 - 17.0 g/dL 87.9  88.1  87.3   Hematocrit 39.0 - 52.0 % 35.4  33.9  38.2   Platelets 150.0 - 400.0 K/uL 479.0  263.0  276.0        Latest Ref Rng & Units 10/20/2024    1:24 PM 05/19/2024    2:11 PM 04/09/2024    9:44 AM  BMP  Glucose 70 - 99 mg/dL 896  883  880   BUN 6 - 23 mg/dL 3  2  6    Creatinine 0.40 - 1.50 mg/dL 9.18   9.05  8.99   Sodium 135 - 145 mEq/L 143  138  137   Potassium 3.5 - 5.1 mEq/L 3.4  3.2  3.6   Chloride 96 - 112 mEq/L 100  94  101   CO2 19 - 32 mEq/L 31  34  27   Calcium  8.4 - 10.5 mg/dL 8.6  7.8  8.7     BNP No results found for: BNP  ProBNP No results found for: PROBNP  PFT    Component Value Date/Time   FEV1PRE 2.84 06/20/2016 0849   FEV1POST 3.15 06/20/2016 0849   FVCPRE 3.48 06/20/2016 0849   FVCPOST 3.78 06/20/2016 0849   TLC 5.99 06/20/2016 0849   DLCOUNC 19.16 06/20/2016 0849   PREFEV1FVCRT 82 06/20/2016 0849   PSTFEV1FVCRT 83 06/20/2016 0849    NM PET Image Initial (PI) Skull Base To Thigh (F-18 FDG) Result Date: 12/07/2024 CLINICAL DATA:  Initial treatment strategy for part solid right upper lobe pulmonary nodule on lung cancer screening chest CT. EXAM: NUCLEAR MEDICINE PET SKULL BASE TO THIGH TECHNIQUE: 8.4 mCi F-18 FDG was injected intravenously. Full-ring PET imaging was performed from the skull base to thigh after the radiotracer. CT data was obtained and used for attenuation correction and anatomic localization. Fasting blood glucose: 115 mg/dl COMPARISON:  Chest CT 98/94/7973, 08/12/2024 and 03/20/2023. FINDINGS: Mediastinal blood pool activity: SUV max 1.8 NECK: No hypermetabolic cervical lymph nodes are identified.Mild asymmetric metabolic activity within the right palatine tonsil (SUV max 5.4) without corresponding suspicious finding on the CT images. No other suspicious activity identified within the pharyngeal mucosal space. Incidental CT findings: Bilateral carotid atherosclerosis. CHEST: There are no hypermetabolic mediastinal, hilar or axillary lymph nodes. No hypermetabolic pulmonary activity. No significant change in part solid right apical nodule measuring 1.1 cm on image 12/7. Incidental CT findings: Stable mild centrilobular emphysema and central airway thickening. Mild atherosclerosis of the aorta, great vessels and coronary arteries. ABDOMEN/PELVIS:  There is no hypermetabolic activity within the liver, adrenal glands, spleen or pancreas. There is no hypermetabolic nodal activity in the abdomen or pelvis. Incidental CT findings: Decreased density throughout the liver, consistent with steatosis. There is a cyst in the anterior interpolar region of the left kidney, measuring 2.2 cm on image 125/4. This demonstrates no metabolic activity; no specific follow-up imaging recommended. Aortoiliac atherosclerosis. SKELETON: There is no hypermetabolic activity to suggest osseous metastatic disease. Incidental CT findings: Mild spondylosis. IMPRESSION: 1. No hypermetabolic activity associated with the part solid right apical pulmonary nodule. This was categorized as Lung-RADS 3 on recent LCS CT. Low-grade adenocarcinoma could have this appearance. Short-term follow-up in 6 months is recommended with repeat low-dose chest CT without contrast. 2. No evidence of metastatic disease. 3. Mild asymmetric metabolic activity within the right  palatine tonsil without corresponding suspicious finding on the CT images, likely inflammatory. Correlate with direct visualization. 4. Hepatic steatosis. 5. Aortic Atherosclerosis (ICD10-I70.0) and Emphysema (ICD10-J43.9). Electronically Signed   By: Elsie Perone M.D.   On: 12/07/2024 07:58   ECHOCARDIOGRAM LIMITED Result Date: 12/06/2024    ECHOCARDIOGRAM LIMITED REPORT   Patient Name:   MITSURU DAULT Date of Exam: 12/04/2024 Medical Rec #:  [id number]      Height:       65.0 in Accession #:    [id number]     Weight:       170.0 lb Date of Birth:  Aug 21, 1966     BSA:          1.846 m Patient Age:    58 years       BP:           124/80 mmHg Patient Gender: M              HR:           102 bpm. Exam Location:  [address] Procedure: 2D Echo, Limited Color Doppler and Cardiac Doppler (Both Spectral and            Color Flow Doppler were utilized during procedure). Indications:    I31.3 Pericardial Effusion  History:        Patient has  prior history of Echocardiogram examinations, most                 recent 10/23/2024. Risk Factors:Sleep Apnea, Hypertension and                 Dyslipidemia.  Sonographer:    Powell Saras Referring Phys: [id number] JARED E SEGAL  Sonographer Comments: Technically difficult study due to poor echo windows. IMPRESSIONS  1. Left ventricular ejection fraction, by estimation, is 60 to 65%. The left ventricle has normal function. The left ventricle has no regional wall motion abnormalities.  2. Right ventricular systolic function is normal. The right ventricular size is normal.  3. Compared with the echo 10/2024, pericardial effusion is smaller. a small pericardial effusion is present. There is no evidence of cardiac tamponade.  4. The mitral valve is normal in structure.  5. The aortic valve is grossly normal. FINDINGS  Left Ventricle: Left ventricular ejection fraction, by estimation, is 60 to 65%. The left ventricle has normal function. The left ventricle has no regional wall motion abnormalities. The left ventricular internal cavity size was normal in size. There is  no left ventricular hypertrophy. Right Ventricle: The right ventricular size is normal. No increase in right ventricular wall thickness. Right ventricular systolic function is normal. Left Atrium: Left atrial size was normal in size. Right Atrium: Right atrial size was normal in size. Pericardium: Compared with the echo 10/2024, pericardial effusion is smaller. A small pericardial effusion is present. There is no evidence of cardiac tamponade. Mitral Valve: The mitral valve is normal in structure. Tricuspid Valve: The tricuspid valve is normal in structure. Aortic Valve: The aortic valve is grossly normal. Aorta: The aortic root and ascending aorta are structurally normal, with no evidence of dilitation. IVC IVC diam: 1.10 cm Annabella Scarce MD Electronically signed by Annabella Scarce MD Signature Date/Time: 12/06/2024/2:39:18 PM    Final    CT CHEST  LCS NODULE F/U LOW DOSE WO CONTRAST Result Date: 11/23/2024 CLINICAL DATA:  59 year old male former smoker presents for three-month follow-up EXAM: CT CHEST WITHOUT CONTRAST FOR LUNG CANCER SCREENING NODULE FOLLOW-UP TECHNIQUE: Multidetector CT imaging of  the chest was performed following the standard protocol without IV contrast. RADIATION DOSE REDUCTION: This exam was performed according to the departmental dose-optimization program which includes automated exposure control, adjustment of the mA and/or kV according to patient size and/or use of iterative reconstruction technique. COMPARISON:  08/12/2024 screening chest CT FINDINGS: Cardiovascular: Normal heart size. No significant pericardial effusion/thickening. Left anterior descending and left circumflex coronary atherosclerosis. Atherosclerotic nonaneurysmal thoracic aorta. Normal caliber pulmonary arteries. Mediastinum/Nodes: No significant thyroid  nodules. Unremarkable esophagus. No pathologically enlarged axillary, mediastinal or hilar lymph nodes, noting limited sensitivity for the detection of hilar adenopathy on this noncontrast study. Lungs/Pleura: No pneumothorax. No pleural effusion. Mild centrilobular emphysema with diffuse bronchial wall thickening. No acute consolidative airspace disease or lung masses. Part solid 14.4 mm medial apical right upper lobe nodule with 1.5 mm solid component on image 46, previously 13.7 mm with 1.2 mm solid component on 08/12/2024 CT, not substantially changed. No new significant pulmonary nodules. Upper abdomen: No acute abnormality. Musculoskeletal: No aggressive appearing focal osseous lesions. Mild thoracic spondylosis. Chronic mild T10 and T11 vertebral compression fractures are unchanged. IMPRESSION: 1. Lung-RADS 3, probably benign findings. Short-term follow-up in 6 months is recommended with repeat low-dose chest CT without contrast (please use the following order, CT CHEST LCS NODULE FOLLOW-UP W/O CM). Part  solid 14.4 mm medial apical right upper lobe nodule with 1.5 mm solid component, not substantially changed since 08/12/2024 CT. Low grade indolent primary bronchogenic adenocarcinoma not excluded. 2. Two-vessel coronary atherosclerosis. 3. Aortic Atherosclerosis (ICD10-I70.0) and Emphysema (ICD10-J43.9). These results will be called to the ordering clinician or representative by the Radiologist Assistant, and communication documented in the PACS or [employer]. Electronically Signed   By: Selinda DELENA Blue M.D.   On: 11/23/2024 10:41     Past medical hx Past Medical History:  Diagnosis Date   Anemia    Anxiety    Blood transfusion without reported diagnosis 10/20/24   Upper GI problem   Depression    Hyperlipidemia    Hypertension    Seizures (HCC)    Sleep apnea    Substance abuse (HCC)      Social History[1]  Mr.Mruk reports that he has been smoking e-cigarettes and cigarettes. He started smoking about 34 years ago. He has a 30 pack-year smoking history. He has never used smokeless tobacco. He reports current alcohol use of about 3.0 standard drinks of alcohol per week. He reports that he does not use drugs.  Tobacco Cessation: Ready to quit: Not Answered Counseling given: Not Answered Former tobacco smoker Current every day vaper  Past surgical hx, Family hx, Social hx all reviewed.  Current Outpatient Medications on File Prior to Visit  Medication Sig   aspirin  EC 81 MG tablet Take 1 tablet (81 mg total) by mouth daily. Swallow whole.   atorvastatin  (LIPITOR) 40 MG tablet Take 1 tablet (40 mg total) by mouth daily.   desvenlafaxine (PRISTIQ) 100 MG 24 hr tablet Take 100 mg by mouth daily.   gabapentin  (NEURONTIN ) 800 MG tablet Take 800 mg by mouth 3 (three) times daily as needed (nerve pain).   JOURNAVX 50 MG TABS 2 tabs first dose then 1 by mouth twice daily for 14 days   lamoTRIgine (LAMICTAL) 100 MG tablet Take 100 mg by mouth daily.   Multiple Vitamins-Minerals  (MULTIVITAMIN WITH MINERALS) tablet Take 1 tablet by mouth daily.   Thiamine  HCl (B-1) 250 MG TABS    traZODone (DESYREL) 50 MG tablet Take 50-100 mg by mouth  at bedtime as needed for sleep.   No current facility-administered medications on file prior to visit.     Allergies[2]  Review Of Systems:  Constitutional:   No  weight loss, night sweats, + Fevers, chills, fatigue, or  lassitude.  HEENT:   No headaches,  Difficulty swallowing,  Tooth/dental problems, or  Sore throat,                No sneezing, itching, ear ache, nasal congestion, post nasal drip,   CV:  No chest pain,  Orthopnea, PND, swelling in lower extremities, anasarca, dizziness, palpitations, syncope.   GI  No heartburn, indigestion, abdominal pain, nausea, vomiting, diarrhea, change in bowel habits, loss of appetite, bloody stools.   Resp: + shortness of breath with exertion or at rest.  No excess mucus, no productive cough,  No non-productive cough,  No coughing up of blood.  No change in color of mucus.  No wheezing.  No chest wall deformity  Skin: no rash or lesions.  GU: no dysuria, change in color of urine, no urgency or frequency.  No flank pain, no hematuria   MS:  No joint pain or swelling.  No decreased range of motion.  No back pain.  Psych:  No change in mood or affect. No depression or anxiety.  No memory loss.   Vital Signs BP 102/81   Pulse (!) 118   Temp 100.1 F (37.8 C) (Oral)   Ht 5' 5 (1.651 m)   Wt 164 lb 3.2 oz (74.5 kg)   SpO2 96%   BMI 27.32 kg/m    Physical Exam:  General- No distress,  A&Ox3, pleasant and appropriate ENT: No sinus tenderness, TM clear, pale nasal mucosa, no oral exudate,no post nasal drip, no LAN Cardiac: S1, S2, regular rate and rhythm, no murmur Chest: No wheeze/ rales/ dullness; no accessory muscle use, no nasal flaring, no sternal retractions Abd.: Soft Non-tender, ND, BS +, Body mass index is 27.32 kg/m.  Ext: No clubbing cyanosis, edema, + lower  extremity weakness that is being evaluated by Neurology Neuro:  normal strength, as noted above lower extremity weakness , MAE x 4, A&O x 3, appropriate Skin: No rashes, warm and dry, no obvious skin lesions  Psych: normal mood and behavior  Physical Exam   Assessment & Plan Abnormal LDCT Lung Cancer Screening. Pulmonary nodule increasing in size with development of solid component Former tobacco smoker  Current every day vaper Dyspnea with exertion Fever of 101  without symptoms in the office  Plan It is good to see you today. Your PET scan does not show the nodule is hypermetabolic. We will do a 3 month follow up LDCT due after 02/26/2025. You will get a call to get this scheduled closer to the time.  You will them follow up with me 1-2 weeks after the scan to review results and determine next steps in plan of care.  You do have a fever of 101 today in the office. You are asymptomatic, which is reassuring. We have tested for Covid and flu, both were negative.  Treat with tylenol   and hydrate.  Follow up with your PCP if you become symptomatic.  Follow up in 3 months to review CT Chest.  I have ordered PFT's to evaluate your shortness of breath. You will get a call to get this scheduled. Follow up with me 1-2 weeks to review results. Please contact office for sooner follow up if symptoms do not improve or worsen or  seek emergency care     I spent 30 minutes dedicated to the care of this patient on the date of this encounter to include pre-visit review of records, face-to-face time with the patient discussing conditions above, post visit ordering of testing, clinical documentation with the electronic health record, making appropriate referrals as documented, and communicating necessary information to the patient's healthcare team.       Lauraine JULIANNA Lites, NP 12/15/2024  4:27 PM             [1]  Social History Tobacco Use   Smoking status: Every Day    Current  packs/day: 0.00    Average packs/day: 1 pack/day for 30.0 years (30.0 ttl pk-yrs)    Types: E-cigarettes, Cigarettes    Start date: 06/21/1990    Last attempt to quit: 06/21/2020    Years since quitting: 4.4   Smokeless tobacco: Never  Vaping Use   Vaping status: Every Day  Substance Use Topics   Alcohol use: Yes    Alcohol/week: 3.0 standard drinks of alcohol    Types: 3 Cans of beer per week    Comment: once a month   Drug use: Never  [2] No Known Allergies  "

## 2024-12-16 ENCOUNTER — Telehealth: Payer: Self-pay | Admitting: Physical Therapy

## 2024-12-16 ENCOUNTER — Ambulatory Visit: Attending: Internal Medicine | Admitting: Physical Therapy

## 2024-12-16 ENCOUNTER — Other Ambulatory Visit: Payer: Self-pay

## 2024-12-16 DIAGNOSIS — R911 Solitary pulmonary nodule: Secondary | ICD-10-CM

## 2024-12-16 DIAGNOSIS — Z122 Encounter for screening for malignant neoplasm of respiratory organs: Secondary | ICD-10-CM

## 2024-12-16 DIAGNOSIS — Z87891 Personal history of nicotine dependence: Secondary | ICD-10-CM

## 2024-12-16 NOTE — Telephone Encounter (Signed)
 Called and spoke to pt regarding no-show to scheduled PT appointment. Pt apologetic for missing appointment and reports he has had a hectic 2 weeks. Informed pt of no-show policy and pt verbalized understanding. Pt requesting to cancel appointments on 2/9 and 2/23 as he will be out of town but confirmed appointment on 2/16.   Harim Bi E Peytyn Trine, PT, DPT

## 2024-12-21 ENCOUNTER — Ambulatory Visit: Admitting: Physical Therapy

## 2024-12-28 ENCOUNTER — Ambulatory Visit: Admitting: Physical Therapy

## 2025-01-04 ENCOUNTER — Ambulatory Visit: Admitting: Physical Therapy

## 2025-01-11 ENCOUNTER — Ambulatory Visit: Attending: Internal Medicine | Admitting: Physical Therapy

## 2025-03-04 ENCOUNTER — Encounter: Admitting: Internal Medicine
